# Patient Record
Sex: Female | Born: 2000 | Race: White | Hispanic: No | Marital: Single | Smoking: Never smoker
Health system: Southern US, Community
[De-identification: ages and names within clinical notes are randomized; demographics above are authoritative.]

## PROBLEM LIST (undated history)

## (undated) DIAGNOSIS — J45909 Unspecified asthma, uncomplicated: Secondary | ICD-10-CM

## (undated) DIAGNOSIS — F32A Depression, unspecified: Secondary | ICD-10-CM

## (undated) DIAGNOSIS — F419 Anxiety disorder, unspecified: Secondary | ICD-10-CM

## (undated) DIAGNOSIS — N390 Urinary tract infection, site not specified: Secondary | ICD-10-CM

## (undated) HISTORY — PX: NO PAST SURGERIES: SHX2092

## (undated) HISTORY — DX: Unspecified asthma, uncomplicated: J45.909

---
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
## 2021-03-23 ENCOUNTER — Other Ambulatory Visit: Payer: Self-pay

## 2021-03-23 ENCOUNTER — Emergency Department (HOSPITAL_BASED_OUTPATIENT_CLINIC_OR_DEPARTMENT_OTHER): Payer: Medicaid Other

## 2021-03-23 ENCOUNTER — Emergency Department (HOSPITAL_BASED_OUTPATIENT_CLINIC_OR_DEPARTMENT_OTHER)
Admission: EM | Admit: 2021-03-23 | Discharge: 2021-03-23 | Disposition: A | Discharge status: Home / Self Care | Payer: Medicaid Other | Attending: Emergency Medicine | Admitting: Emergency Medicine

## 2021-03-23 ENCOUNTER — Encounter (HOSPITAL_BASED_OUTPATIENT_CLINIC_OR_DEPARTMENT_OTHER): Payer: Self-pay | Admitting: *Deleted

## 2021-03-23 DIAGNOSIS — S29011A Strain of muscle and tendon of front wall of thorax, initial encounter: Secondary | ICD-10-CM | POA: Diagnosis not present

## 2021-03-23 DIAGNOSIS — R000 Tachycardia, unspecified: Secondary | ICD-10-CM | POA: Diagnosis not present

## 2021-03-23 DIAGNOSIS — Y92410 Unspecified street and highway as the place of occurrence of the external cause: Secondary | ICD-10-CM | POA: Insufficient documentation

## 2021-03-23 DIAGNOSIS — S46912A Strain of unspecified muscle, fascia and tendon at shoulder and upper arm level, left arm, initial encounter: Secondary | ICD-10-CM | POA: Insufficient documentation

## 2021-03-23 DIAGNOSIS — T148XXA Other injury of unspecified body region, initial encounter: Secondary | ICD-10-CM

## 2021-03-23 DIAGNOSIS — S161XXA Strain of muscle, fascia and tendon at neck level, initial encounter: Secondary | ICD-10-CM | POA: Insufficient documentation

## 2021-03-23 DIAGNOSIS — S169XXA Unspecified injury of muscle, fascia and tendon at neck level, initial encounter: Secondary | ICD-10-CM | POA: Diagnosis present

## 2021-03-23 LAB — PREGNANCY, URINE: Preg Test, Ur: NEGATIVE

## 2021-03-23 MED ORDER — OXYCODONE-ACETAMINOPHEN 5-325 MG PO TABS
1.0000 | ORAL_TABLET | Freq: Once | ORAL | Status: AC
  Administered 2021-03-23: 1 via ORAL
  Filled 2021-03-23: qty 1

## 2021-03-23 NOTE — ED Notes (Signed)
Pt called for xray , pt declined xray , request to see provider first

## 2021-03-23 NOTE — Discharge Instructions (Addendum)
Follow-up with primary care doctor as needed.  Take Tylenol or Motrin as needed for pain control.  Come back to ER if you develop difficulty breathing, abdominal pain or other new concerning symptoms.
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
## 2021-07-02 NOTE — L&D Delivery Note (Signed)
[facility] Delivery Note  Samantha Walton is a 21 y.o. G1P0 s/p SVD at [redacted]w[redacted]d. She was admitted for SOL/SROM.   ROM: 14h 58m with clear fluid GBS Status: neg Maximum Maternal Temperature: 98.2  Labor Progress: Progressed well with pitocin augmentation  Delivery Date/Time: 04/18/22, 0932 Delivery: Called to room and patient was complete and pushing. Head delivered ROA. Nuchal cord present, easily reduced. Shoulder and body delivered in usual fashion. Infant with spontaneous cry, placed on mother's abdomen, dried and stimulated. Cord clamped x 2 after 1-minute delay, and cut by FOB under my direct supervision. Cord blood drawn. Placenta delivered spontaneously with gentle cord traction. Fundus firm with massage and Pitocin. Labia, perineum, vagina, and cervix were inspected, no perineal lacerations. Superficial, non-bleeding labial abrasions left to heal by secondary intention.    Placenta: complete, three vessel cord appreciated Complications: none Lacerations: none EBL: 87 mL Analgesia: epidural  Postpartum Planning [ ]  message to sent to schedule follow-up  [ ]  vaccines UTD  Infant: female  APGARs 9/9  weight pending  Cecilio Asper, MD [facility], [place]

## 2021-10-11 ENCOUNTER — Telehealth (INDEPENDENT_AMBULATORY_CARE_PROVIDER_SITE_OTHER): Payer: Medicaid Other

## 2021-10-11 DIAGNOSIS — Z3401 Encounter for supervision of normal first pregnancy, first trimester: Secondary | ICD-10-CM

## 2021-10-11 DIAGNOSIS — Z3A00 Weeks of gestation of pregnancy not specified: Secondary | ICD-10-CM

## 2021-10-11 DIAGNOSIS — Z3400 Encounter for supervision of normal first pregnancy, unspecified trimester: Secondary | ICD-10-CM | POA: Insufficient documentation

## 2021-10-11 MED ORDER — GOJJI WEIGHT SCALE MISC: 1.0000 | 0 refills | Status: DC | PRN

## 2021-10-11 MED ORDER — BLOOD PRESSURE KIT DEVI: 1.0000 | 0 refills | Status: DC | PRN

## 2021-10-11 NOTE — Progress Notes (Signed)
New OB Intake ? ?I connected with  Samantha Walton on 10/11/21 at  8:15 AM EDT by MyChart Video Visit and verified that I am speaking with the correct person using two identifiers. Nurse is located at [facility] and pt is located at home. ? ?I discussed the limitations, risks, security and privacy concerns of performing an evaluation and management service by telephone and the availability of in person appointments. I also discussed with the patient that there may be a patient responsible charge related to this service. The patient expressed understanding and agreed to proceed. ? ?I explained I am completing New OB Intake today. We discussed her EDD of 04/28/2021 that is based on LMP of 07/22/2021. Pt is G1/P0. I reviewed her allergies, medications, Medical/Surgical/OB history, and appropriate screenings. I informed her of [facility] services. Based on history, this is a/an  pregnancy uncomplicated .  ? ?Patient Active Problem List  ? Diagnosis Date Noted  ? Supervision of low-risk first pregnancy 10/11/2021  ? ? ?Concerns addressed today ?-Patient was informed starting at age 5 we do pap smear for cervical cancer screening, patient is aware. ?-CMA will send list of pediatrician and medication that are safe to take while pregnancy through MyChart account. ?-Sent Blood Presssure device and weight scale to [employer]. CMA will provide phone number and address through MyChart.  ? ?Delivery Plans:  ?Plans to deliver at [facility].  ? ?MyChart/Babyscripts ?MyChart access verified. I explained pt will have some visits in office and some virtually. Babyscripts instructions given and order placed. Patient verifies receipt of registration text/e-mail. Account successfully created and app downloaded. ? ?Blood Pressure Cuff  ?Blood pressure cuff ordered for patient to pick-up from [employer]. Explained after first prenatal appt pt will check weekly and document in Babyscripts. ? ?Weight scale: Patient does not  have weight  scale. Weight scale ordered for patient to pick up from [employer].  ? ?Anatomy US ?Explained first scheduled [place] will be around 19 weeks. Anatomy US scheduled for 12/04/2021 at 2:30PM. Pt notified to arrive at 2:15PM. ?Scheduled AFP lab only appointment if CenteringPregnancy pt for same day as anatomy US.  ? ?Labs ?Discussed Avelina Laine genetic screening with patient. Would like both Panorama and Horizon drawn at new OB visit.Also if interested in genetic testing, tell patient she will need AFP 15-21 weeks to complete genetic testing .Routine prenatal labs needed. ? ?Covid Vaccine ?Patient has not covid vaccine.  ? ?Is patient a CenteringPregnancy candidate? Declined  ?  ?Is patient a Mom+Baby Combined Care candidate? Declined   ?  ? ?Informed patient of [facility] website  and placed link in her AVS.  ? ?Social Determinants of Health ?Food Insecurity: Patient denies food insecurity. ?WIC Referral: Patient is interested in referral to [facility].  ?Transportation: Patient denies transportation needs. ?Childcare: Discussed no children allowed at ultrasound appointments. Offered childcare services; patient declines childcare services at this time. ? ?Send link to Pregnancy Navigators ? ? ?Placed OB Box on problem list and updated ? ?First visit review ?I reviewed new OB appt with pt. I explained she will have a pelvic exam, ob bloodwork with genetic screening.. Explained pt will be seen by Jamilla R. Walker CNM at first visit; encounter routed to appropriate provider. Explained that patient will be seen by pregnancy navigator following visit with provider. [facility] information placed in AVS.  ? ?Vidal Schwalbe, CMA ?10/11/2021  8:37 AM  ?

## 2021-10-23 ENCOUNTER — Ambulatory Visit (INDEPENDENT_AMBULATORY_CARE_PROVIDER_SITE_OTHER): Payer: Medicaid Other | Admitting: Certified Nurse Midwife

## 2021-10-23 ENCOUNTER — Other Ambulatory Visit (HOSPITAL_COMMUNITY)
Admission: RE | Admit: 2021-10-23 | Discharge: 2021-10-23 | Disposition: A | Discharge status: Home / Self Care | Payer: Medicaid Other | Source: Ambulatory Visit | Attending: Certified Nurse Midwife | Admitting: Certified Nurse Midwife

## 2021-10-23 VITALS — BP 117/62 | HR 98 | Wt 121.0 lb

## 2021-10-23 DIAGNOSIS — Z3A13 13 weeks gestation of pregnancy: Secondary | ICD-10-CM

## 2021-10-23 DIAGNOSIS — Z3400 Encounter for supervision of normal first pregnancy, unspecified trimester: Secondary | ICD-10-CM

## 2021-10-23 DIAGNOSIS — A749 Chlamydial infection, unspecified: Secondary | ICD-10-CM

## 2021-10-23 DIAGNOSIS — Z111 Encounter for screening for respiratory tuberculosis: Secondary | ICD-10-CM

## 2021-10-23 DIAGNOSIS — Z3491 Encounter for supervision of normal pregnancy, unspecified, first trimester: Secondary | ICD-10-CM

## 2021-10-23 DIAGNOSIS — O2342 Unspecified infection of urinary tract in pregnancy, second trimester: Secondary | ICD-10-CM

## 2021-10-23 DIAGNOSIS — O98811 Other maternal infectious and parasitic diseases complicating pregnancy, first trimester: Secondary | ICD-10-CM

## 2021-10-23 NOTE — Progress Notes (Signed)
? ?History:  ? Samantha Walton is a 21 y.o. G1P0 at 54w2dby LMP/early ultrasound being seen today for her first obstetrical visit.  Her obstetrical history is significant for  none . Patient does intend to breast feed, wants Depo for birth control and would like a hospital circumcision for her baby if female. Pregnancy history fully reviewed. ? ?Patient reports no complaints. Has only had very mild nausea. Now having urinary symptoms consistent with prior UTIs: burning and pain with urination. Usually clears with increased water and cranberry juice but this has been going on for a full week with no improvement in symptoms. ?  ?HISTORY: ?OB History  ?Gravida Para Term Preterm AB Living  ?1 0 0 0 0 0  ?SAB IAB Ectopic Multiple Live Births  ?0 0 0 0 0  ?  ?# Outcome Date GA Lbr Len/2nd Weight Sex Delivery Anes PTL Lv  ?1 Current           ?  ?Pap smear not indicated due to age. ? ?Past Medical History:  ?Diagnosis Date  ? Asthma   ? ?No past surgical history on file. ?No family history on file. ?Social History  ? ?Tobacco Use  ? Smoking status: Never  ?Vaping Use  ? Vaping Use: Some days  ?Substance Use Topics  ? Alcohol use: No  ? Drug use: No  ? ?No Known Allergies ?Current Outpatient Medications on File Prior to Visit  ?Medication Sig Dispense Refill  ? Blood Pressure Monitoring (BLOOD PRESSURE KIT) DEVI 1 Device by Does not apply route as needed. 1 each 0  ? Misc. Devices (GOJJI WEIGHT SCALE) MISC 1 Device by Does not apply route as needed. 1 each 0  ? Prenatal Vit-Fe Fumarate-FA (PRENATAL PO) Take by mouth.    ? adapalene (DIFFERIN) 0.1 % gel Apply topically at bedtime. (Patient not taking: Reported on 10/11/2021) 45 g 0  ? clindamycin-benzoyl peroxide (BENZACLIN) gel Apply topically 2 (two) times daily. (Patient not taking: Reported on 10/11/2021) 25 g 0  ? estradiol cypionate (DEPO-ESTRADIOL) 5 MG/ML injection Inject into the muscle every 28 (twenty-eight) days. (Patient not taking: Reported on 10/11/2021)    ? ?No  current facility-administered medications on file prior to visit.  ? ? ?Review of Systems ?Pertinent items noted in HPI and remainder of comprehensive ROS otherwise negative. ?Physical Exam:  ? ?Vitals:  ? 10/23/21 1403  ?BP: 117/62  ?Pulse: 98  ?Weight: 120 lb 15.4 oz (54.9 kg)  ? ?Bedside Ultrasound for FHR check: cardiac activity noted as well as movement ?Patient informed that the ultrasound is considered a limited obstetric ultrasound and is not intended to be a complete ultrasound exam.  Patient also informed that the ultrasound is not being completed with the intent of assessing for fetal or placental anomalies or any pelvic abnormalities.  Explained that the purpose of today?s ultrasound is to assess for fetal heart rate.  Patient acknowledges the purpose of the exam and the limitations of the study. ? ?Constitutional: Well-developed, well-nourished pregnant female in no acute distress.  ?HEENT: PERRLA ?Skin: normal color and turgor, no rash ?Cardiovascular: normal rate & rhythm, no murmur ?Respiratory: normal effort ?GI: Abd soft, non-tender, gravid appropriate for gestational age ?MS: Extremities nontender, no edema, normal ROM ?Neurologic: Alert and oriented x 4.  ?GU: no CVA tenderness ?Pelvic: pelvic exam deferred, self-swabs collected ? ?Assessment & Plan:  ?1. Encounter for supervision of low-risk first pregnancy, antepartum ?- Doing well overall, no fetal movement yet ? ?2.  [redacted] weeks gestation of pregnancy ?- Routine OB care ? ?3. Screening for tuberculosis (for work) ?- Quantiferon-TB Gold plus ? ?4. Dysuria ?- urinalysis with reflex culture ? ?3. Initial obstetric visit in first trimester ?- Initial labs drawn. ?- Continue prenatal vitamins. ?- Problem list reviewed and updated. ?- Genetic Screening discussed, First trimester screen, Quad screen, and NIPS:  drawn . ?- Ultrasound discussed; fetal anatomic survey:  already scheduled . ?- Anticipatory guidance about prenatal visits given including  labs, ultrasounds, and testing. ?- Discussed usage of Babyscripts and virtual visits as additional source of managing and completing prenatal visits in midst of coronavirus and pandemic.   ?- Encouraged to complete MyChart Registration for her ability to review results, send requests, and have questions addressed.  ?- The nature of [place] [facility]/Faculty Practice with multiple MDs and Advanced Practice Providers was explained to patient; also emphasized that residents, students are part of our team. ?- Routine obstetric precautions reviewed. Encouraged to seek out care at office or emergency room [facility] MAU preferred) for urgent and/or emergent concerns. ?Return in about 2 weeks (around 11/06/2021) for IN-PERSON, LOB.  ?  ?Gaylan Gerold, MSN, CNM, IBCLC ?Certified Nurse Midwife, [place] ? ?

## 2021-10-24 ENCOUNTER — Encounter: Payer: Self-pay | Admitting: Certified Nurse Midwife

## 2021-10-24 LAB — CERVICOVAGINAL ANCILLARY ONLY
Chlamydia: POSITIVE — AB
Comment: NEGATIVE
Comment: NEGATIVE
Comment: NORMAL
Neisseria Gonorrhea: NEGATIVE
Trichomonas: NEGATIVE

## 2021-10-24 MED ORDER — AZITHROMYCIN 500 MG PO TABS: 1000.0000 mg | ORAL_TABLET | Freq: Once | ORAL | 1 refills | Status: AC

## 2021-10-24 NOTE — Addendum Note (Signed)
Addended by: Edd Arbour on: 10/24/2021 09:08 PM ? ? Modules accepted: Orders ? ?

## 2021-10-26 LAB — CBC/D/PLT+RPR+RH+ABO+RUBIGG...
Antibody Screen: NEGATIVE
Basophils Absolute: 0 10*3/uL (ref 0.0–0.2)
Basos: 0 %
EOS (ABSOLUTE): 0.1 10*3/uL (ref 0.0–0.4)
Eos: 1 %
HCV Ab: NONREACTIVE
HIV Screen 4th Generation wRfx: NONREACTIVE
Hematocrit: 37.5 % (ref 34.0–46.6)
Hemoglobin: 13 g/dL (ref 11.1–15.9)
Hepatitis B Surface Ag: NEGATIVE
Immature Grans (Abs): 0 10*3/uL (ref 0.0–0.1)
Immature Granulocytes: 0 %
Lymphocytes Absolute: 2.2 10*3/uL (ref 0.7–3.1)
Lymphs: 20 %
MCH: 30.4 pg (ref 26.6–33.0)
MCHC: 34.7 g/dL (ref 31.5–35.7)
MCV: 88 fL (ref 79–97)
Monocytes Absolute: 0.7 10*3/uL (ref 0.1–0.9)
Monocytes: 6 %
Neutrophils Absolute: 8 10*3/uL — ABNORMAL HIGH (ref 1.4–7.0)
Neutrophils: 73 %
Platelets: 265 10*3/uL (ref 150–450)
RBC: 4.27 x10E6/uL (ref 3.77–5.28)
RDW: 12.9 % (ref 11.7–15.4)
RPR Ser Ql: NONREACTIVE
Rh Factor: POSITIVE
Rubella Antibodies, IGG: 1.66 index (ref >0.99)
WBC: 11 10*3/uL — ABNORMAL HIGH (ref 3.4–10.8)

## 2021-10-26 LAB — QUANTIFERON-TB GOLD PLUS
QuantiFERON Mitogen Value: >10 IU/mL
QuantiFERON Nil Value: 0.14 IU/mL
QuantiFERON TB1 Ag Value: 0.13 IU/mL
QuantiFERON TB2 Ag Value: 0.12 IU/mL
QuantiFERON-TB Gold Plus: NEGATIVE

## 2021-10-26 LAB — HEMOGLOBIN A1C
Est. average glucose Bld gHb Est-mCnc: 103 mg/dL
Hgb A1c MFr Bld: 5.2 % (ref 4.8–5.6)

## 2021-10-26 LAB — CULTURE, OB URINE

## 2021-10-26 LAB — URINE CULTURE, OB REFLEX

## 2021-10-26 LAB — HCV INTERPRETATION

## 2021-10-28 ENCOUNTER — Encounter (HOSPITAL_COMMUNITY): Payer: Self-pay | Admitting: Family Medicine

## 2021-10-28 ENCOUNTER — Inpatient Hospital Stay (HOSPITAL_COMMUNITY)
Admission: AD | Admit: 2021-10-28 | Discharge: 2021-10-28 | Disposition: A | Discharge status: Home / Self Care | Payer: Medicaid Other | Attending: Family Medicine | Admitting: Family Medicine

## 2021-10-28 DIAGNOSIS — O26892 Other specified pregnancy related conditions, second trimester: Secondary | ICD-10-CM | POA: Insufficient documentation

## 2021-10-28 DIAGNOSIS — R519 Headache, unspecified: Secondary | ICD-10-CM | POA: Insufficient documentation

## 2021-10-28 DIAGNOSIS — O219 Vomiting of pregnancy, unspecified: Secondary | ICD-10-CM | POA: Insufficient documentation

## 2021-10-28 DIAGNOSIS — Z3A14 14 weeks gestation of pregnancy: Secondary | ICD-10-CM | POA: Diagnosis not present

## 2021-10-28 HISTORY — DX: Anxiety disorder, unspecified: F41.9

## 2021-10-28 HISTORY — DX: Depression, unspecified: F32.A

## 2021-10-28 HISTORY — DX: Urinary tract infection, site not specified: N39.0

## 2021-10-28 LAB — URINALYSIS, ROUTINE W REFLEX MICROSCOPIC
Bilirubin Urine: NEGATIVE
Glucose, UA: NEGATIVE mg/dL
Hgb urine dipstick: NEGATIVE
Ketones, ur: NEGATIVE mg/dL
Leukocytes,Ua: NEGATIVE
Nitrite: NEGATIVE
Protein, ur: NEGATIVE mg/dL
Specific Gravity, Urine: 1.024 (ref 1.005–1.030)
pH: 6 (ref 5.0–8.0)

## 2021-10-28 MED ORDER — CYCLOBENZAPRINE HCL 10 MG PO TABS: 10.0000 mg | ORAL_TABLET | Freq: Three times a day (TID) | ORAL | 1 refills | Status: DC | PRN

## 2021-10-28 MED ORDER — LACTATED RINGERS IV BOLUS
1000.0000 mL | Freq: Once | INTRAVENOUS | Status: AC
  Administered 2021-10-28: 1000 mL via INTRAVENOUS

## 2021-10-28 MED ORDER — DIPHENHYDRAMINE HCL 50 MG/ML IJ SOLN
25.0000 mg | Freq: Once | INTRAMUSCULAR | Status: AC
  Administered 2021-10-28: 25 mg via INTRAVENOUS
  Filled 2021-10-28: qty 1

## 2021-10-28 MED ORDER — METOCLOPRAMIDE HCL 5 MG/ML IJ SOLN
10.0000 mg | Freq: Once | INTRAMUSCULAR | Status: AC
Start: 2021-10-28 — End: 2021-10-28
  Administered 2021-10-28: 10 mg via INTRAVENOUS
  Filled 2021-10-28: qty 2

## 2021-10-28 NOTE — MAU Provider Note (Signed)
Patient Samantha Walton is a 21 y.o. G1P0 ? At 29w0dhere with complaints of headache that has been going for two weeks. She reports that she has had a HA for two weeks; been taking Tylenol 10064mq 6 hours at home, which has been helping. However, she and her FOB were concerned today about her tylenol use so they came in to be evaluated. She denies VB, abdominal pain, dysuria, vaginal discharge, SOB, fever, weakness, visual changes, tingling or any neurologic symptoms. She is an EMT.  ? ? ?History  ?  ? ?CSN: [id number] ?Arrival date and time: 10/28/21 1757 ? ? Event Date/Time  ? First Provider Initiated Contact with Patient 10/28/21 1903   ?  ? ?Chief Complaint  ?Patient presents with  ? Headache  ? ?Headache  ?This is a new problem. The current episode started 1 to 4 weeks ago. The problem occurs constantly. The pain is located in the Occipital region. The pain does not radiate. The quality of the pain is described as aching. The pain is at a severity of 3/10. The pain is mild. Associated symptoms include photophobia and vomiting. Pertinent negatives include no blurred vision, coughing, fever, insomnia, muscle aches, numbness, phonophobia, seizures or tinnitus.  ?She reports a slight sensitivity to light and also vomited this morning.  ?OB History   ? ? Gravida  ?1  ? Para  ?   ? Term  ?   ? Preterm  ?   ? AB  ?   ? Living  ?   ?  ? ? SAB  ?   ? IAB  ?   ? Ectopic  ?   ? Multiple  ?   ? Live Births  ?   ?   ?  ?  ? ? ?Past Medical History:  ?Diagnosis Date  ? Anxiety   ? Asthma   ? as a child  ? Depression   ? hx of taking Lexapro, been off it for a while, doing ok  ? UTI (urinary tract infection)   ? ? ?Past Surgical History:  ?Procedure Laterality Date  ? NO PAST SURGERIES    ? ? ?History reviewed. No pertinent family history. ? ?Social History  ? ?Tobacco Use  ? Smoking status: Never  ?Vaping Use  ? Vaping Use: Some days  ?Substance Use Topics  ? Alcohol use: No  ? Drug use: No  ? ? ?Allergies: No Known  Allergies ? ?Medications Prior to Admission  ?Medication Sig Dispense Refill Last Dose  ? acetaminophen (TYLENOL) 500 MG tablet Take 1,000 mg by mouth every 6 (six) hours as needed.   10/28/2021 at 1330  ? cetirizine (ZYRTEC) 10 MG tablet Take 10 mg by mouth daily. HA? Sinus or allergy related   10/28/2021  ? Blood Pressure Monitoring (BLOOD PRESSURE KIT) DEVI 1 Device by Does not apply route as needed. 1 each 0   ? Misc. Devices (GOJJI WEIGHT SCALE) MISC 1 Device by Does not apply route as needed. 1 each 0   ? Prenatal Vit-Fe Fumarate-FA (PRENATAL PO) Take by mouth.   10/26/2021  ? ? ?Review of Systems  ?Constitutional: Negative.  Negative for fever.  ?HENT: Negative.  Negative for tinnitus.   ?Eyes:  Positive for photophobia. Negative for blurred vision.  ?Respiratory: Negative.  Negative for cough.   ?Cardiovascular: Negative.   ?Gastrointestinal:  Positive for vomiting.  ?Genitourinary:  Negative for dyspareunia and vaginal bleeding.  ?Musculoskeletal: Negative.   ?Neurological:  Positive  for headaches. Negative for seizures and numbness.  ?Psychiatric/Behavioral:  The patient does not have insomnia.   ?Physical Exam  ? ?Blood pressure 117/63, pulse 94, temperature 98.6 ?F (37 ?C), temperature source Oral, resp. rate 16, height 5' 2"  (1.575 m), weight 55.9 kg, last menstrual period 07/22/2021, SpO2 100 %. ? ?Physical Exam ?Constitutional:   ?   Appearance: She is well-developed.  ?HENT:  ?   Head: Normocephalic.  ?Eyes:  ?   Extraocular Movements: Extraocular movements intact.  ?Pulmonary:  ?   Effort: Pulmonary effort is normal.  ?Abdominal:  ?   Palpations: Abdomen is soft.  ?Musculoskeletal:     ?   General: Normal range of motion.  ?Skin: ?   General: Skin is warm.  ?Neurological:  ?   Mental Status: She is alert.  ? ?MAU Course  ?Procedures ? ?MDM ?-patient had 1 L of LR IV plus reglan and benadryl and reports HA is now a 0/10. She denies any other complaints; she would like discharge.  ?-CBC not repeated as  it was just done on 4/24 and it was 13.0 ?FHT: 156 ?She denies any nausea, vomiting or other concerns at this time.  ?Assessment and Plan  ? ?1. Headache in pregnancy, antepartum, second trimester   ?-patient would like HA appt; message sent to [facility] to schedule HA consult ?-discouraged daily use of tylenol, although she is not exceeding the daily dose. Discussed that may need to go on a triptan if HA continue. If HA changes or she has neurological concerms, she should return to MAU.  ?-Rx for flexeril given, also recommended Mag Citrate 400-600 mg daily for HA.  ?-keep follow up appt on 5/10 ? ?Mervyn Skeeters Anila Bojarski ?10/28/2021, 8:45 PM  ?

## 2021-10-28 NOTE — Progress Notes (Addendum)
Reviewed the headache diary/log with patient that was printed with AVS.  Patient agrees to keep a log of date, time, location of headache. Prescription reviewed.  Patient verbalized understanding. Patient left ambulatory without distress.  ?

## 2021-10-28 NOTE — MAU Note (Incomplete)
Samantha Walton is a 21 y.o. at [redacted]w[redacted]d here in MAU reporting: been getting these headaches the past 2-3 wks.  Has been taking Tylenol. This morning, she had a pounding headache, threw up. She took Tylenol, went on to work, HA came back when Tylenol wore off. Been taking allergy medicine too, doesn't seem to be working either.. checked her BP, normal ? ?Onset of complaint: 2-3 wks ?Pain score: 2 ?Vitals:  ? 10/28/21 1809  ?BP: 117/63  ?Pulse: 94  ?Resp: 16  ?Temp: 98.6 ?F (37 ?C)  ?SpO2: 100%  ?   ?FHT:156 ?Lab orders placed from triage:  urine ?

## 2021-10-29 ENCOUNTER — Other Ambulatory Visit: Payer: Self-pay | Admitting: Student

## 2021-10-31 MED ORDER — CEFADROXIL 500 MG PO CAPS: 500.0000 mg | ORAL_CAPSULE | Freq: Two times a day (BID) | ORAL | 0 refills | Status: DC

## 2021-10-31 NOTE — Addendum Note (Signed)
Addended by: Edd Arbour on: 10/31/2021 09:45 AM ? ? Modules accepted: Orders ? ?

## 2021-11-02 DIAGNOSIS — Z0279 Encounter for issue of other medical certificate: Secondary | ICD-10-CM

## 2021-11-06 ENCOUNTER — Encounter: Payer: Self-pay | Admitting: Certified Nurse Midwife

## 2021-11-08 ENCOUNTER — Encounter: Payer: Medicaid Other | Admitting: Certified Nurse Midwife

## 2021-11-15 ENCOUNTER — Ambulatory Visit (INDEPENDENT_AMBULATORY_CARE_PROVIDER_SITE_OTHER): Payer: Medicaid Other | Admitting: Obstetrics and Gynecology

## 2021-11-15 VITALS — BP 114/73 | HR 120 | Wt 121.4 lb

## 2021-11-15 DIAGNOSIS — Z3A16 16 weeks gestation of pregnancy: Secondary | ICD-10-CM

## 2021-11-15 DIAGNOSIS — Z3402 Encounter for supervision of normal first pregnancy, second trimester: Secondary | ICD-10-CM

## 2021-11-15 NOTE — Progress Notes (Signed)
? ?  PRENATAL VISIT NOTE ? ?Subjective:  ?Samantha Walton is a 21 y.o. G1P0 at [redacted]w[redacted]d being seen today for ongoing prenatal care.  She is currently monitored for the following issues for this low-risk pregnancy and has Supervision of low-risk first pregnancy on their problem list. ? ?Patient doing well with no acute concerns today. She reports no complaints.  Contractions: Not present. Vag. Bleeding: None.  Movement: Absent. Denies leaking of fluid.  ? ?The following portions of the patient's history were reviewed and updated as appropriate: allergies, current medications, past family history, past medical history, past social history, past surgical history and problem list. Problem list updated. ? ?Objective:  ? ?Vitals:  ? 11/15/21 1104  ?BP: 114/73  ?Pulse: (!) 120  ?Weight: 121 lb 6.4 oz (55.1 kg)  ? ? ?Fetal Status: Fetal Heart Rate (bpm): 145 Fundal Height: 16 cm Movement: Absent    ? ?General:  Alert, oriented and cooperative. Patient is in no acute distress.  ?Skin: Skin is warm and dry. No rash noted.   ?Cardiovascular: Normal heart rate noted  ?Respiratory: Normal respiratory effort, no problems with respiration noted  ?Abdomen: Soft, gravid, appropriate for gestational age.  Pain/Pressure: Absent     ?Pelvic: Cervical exam deferred        ?Extremities: Normal range of motion.     ?Mental Status:  Normal mood and affect. Normal behavior. Normal judgment and thought content.  ? ?Assessment and Plan:  ?Pregnancy: G1P0 at [redacted]w[redacted]d ? ?1. [redacted] weeks gestation of pregnancy ? ? ?2. Encounter for supervision of low-risk first pregnancy in second trimester ?Counter routine prenatal care ? ?- AFP, Serum, Open Spina Bifida ? ?Preterm labor symptoms and general obstetric precautions including but not limited to vaginal bleeding, contractions, leaking of fluid and fetal movement were reviewed in detail with the patient. ? ?Please refer to After Visit Summary for other counseling recommendations.  ? ?Return in about 4 weeks  (around 12/13/2021) for ROB, in person. ? ? ?Mariel Aloe, MD ?Faculty Attending ?Center for [facility] ?  ?

## 2021-11-18 LAB — AFP, SERUM, OPEN SPINA BIFIDA
AFP MoM: 1.56
AFP Value: 67.4 ng/mL
Gest. Age on Collection Date: 16.6 weeks
Maternal Age At EDD: 21.1 yr
OSBR Risk 1 IN: 2331
Test Results:: NEGATIVE
Weight: 121 [lb_av]

## 2021-12-01 ENCOUNTER — Institutional Professional Consult (permissible substitution): Payer: Medicaid Other | Admitting: Physician Assistant

## 2021-12-04 ENCOUNTER — Other Ambulatory Visit: Payer: Self-pay | Admitting: Certified Nurse Midwife

## 2021-12-04 ENCOUNTER — Ambulatory Visit: Payer: Medicaid Other | Attending: Certified Nurse Midwife

## 2021-12-04 DIAGNOSIS — Z363 Encounter for antenatal screening for malformations: Secondary | ICD-10-CM | POA: Insufficient documentation

## 2021-12-04 DIAGNOSIS — Z3A19 19 weeks gestation of pregnancy: Secondary | ICD-10-CM | POA: Diagnosis not present

## 2021-12-04 DIAGNOSIS — O43122 Velamentous insertion of umbilical cord, second trimester: Secondary | ICD-10-CM | POA: Diagnosis not present

## 2021-12-04 DIAGNOSIS — O321XX0 Maternal care for breech presentation, not applicable or unspecified: Secondary | ICD-10-CM | POA: Diagnosis not present

## 2021-12-04 DIAGNOSIS — Z3401 Encounter for supervision of normal first pregnancy, first trimester: Secondary | ICD-10-CM

## 2021-12-05 ENCOUNTER — Other Ambulatory Visit: Payer: Self-pay | Admitting: *Deleted

## 2021-12-05 DIAGNOSIS — O43122 Velamentous insertion of umbilical cord, second trimester: Secondary | ICD-10-CM

## 2021-12-18 ENCOUNTER — Encounter: Payer: Medicaid Other | Admitting: Medical

## 2022-01-03 ENCOUNTER — Ambulatory Visit (INDEPENDENT_AMBULATORY_CARE_PROVIDER_SITE_OTHER): Payer: Medicaid Other | Admitting: Certified Nurse Midwife

## 2022-01-03 ENCOUNTER — Other Ambulatory Visit: Payer: Self-pay

## 2022-01-03 VITALS — BP 109/69 | HR 100 | Wt 131.8 lb

## 2022-01-03 DIAGNOSIS — Z3A23 23 weeks gestation of pregnancy: Secondary | ICD-10-CM

## 2022-01-03 DIAGNOSIS — O43122 Velamentous insertion of umbilical cord, second trimester: Secondary | ICD-10-CM

## 2022-01-03 DIAGNOSIS — Z3492 Encounter for supervision of normal pregnancy, unspecified, second trimester: Secondary | ICD-10-CM

## 2022-01-03 NOTE — Patient Instructions (Signed)
[facility] [address], [place], [place] [address] [phone] Hours: Sunday Closed Monday 9AM-6PM Tuesday 9AM-6PM Wednesday 9AM-6PM Thursday 9AM-6PM Friday           9AM-6PM Saturday         10AM-1PM  

## 2022-01-03 NOTE — Progress Notes (Signed)
   PRENATAL VISIT NOTE  Subjective:  Samantha Walton is a 21 y.o. G1P0 at [redacted]w[redacted]d being seen today for ongoing prenatal care.  She is currently monitored for the following issues for this low-risk pregnancy and has Supervision of low-risk first pregnancy on their problem list.  Patient reports no complaints.  Contractions: Not present. Vag. Bleeding: None.  Movement: Present. Denies leaking of fluid.   The following portions of the patient's history were reviewed and updated as appropriate: allergies, current medications, past family history, past medical history, past social history, past surgical history and problem list.   Objective:   Vitals:   01/03/22 0933  BP: 109/69  Pulse: 100  Weight: 131 lb 12.8 oz (59.8 kg)    Fetal Status: Fetal Heart Rate (bpm): 148   Movement: Present     General:  Alert, oriented and cooperative. Patient is in no acute distress.  Skin: Skin is warm and dry. No rash noted.   Cardiovascular: Normal heart rate noted  Respiratory: Normal respiratory effort, no problems with respiration noted  Abdomen: Soft, gravid, appropriate for gestational age.  Pain/Pressure: Absent     Pelvic: Cervical exam deferred        Extremities: Normal range of motion.  Edema: None  Mental Status: Normal mood and affect. Normal behavior. Normal judgment and thought content.   Assessment and Plan:  Pregnancy: G1P0 at [redacted]w[redacted]d 1. Encounter for supervision of low-risk pregnancy in second trimester - Doing well, feeling regular and vigorous fetal movement   2. [redacted] weeks gestation of pregnancy - Routine OB care including anticipatory guidance re GTT at next visit  3. 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 Reassured her that these risks are low and we will monitor closely to avoid complications.    Preterm labor symptoms and general obstetric precautions including but not limited to vaginal bleeding, contractions, leaking of fluid and fetal movement were reviewed in detail with the patient. Please refer to After Visit Summary for other counseling recommendations.   Return in about 4 weeks (around 01/31/2022) for IN-PERSON, LOB/GTT.  Future Appointments  Date Time Provider Department Center  01/08/2022  2:30 PM [facility] NURSE [facility]  01/08/2022  2:45 PM [facility] US4 [facility]  02/01/2022  8:20 AM [facility] LAB [facility]  02/01/2022 10:35 AM Dan Humphreys, Judye Bos, CNM [facility]    Bernerd Limbo, CNM

## 2022-01-08 ENCOUNTER — Encounter: Payer: Self-pay | Admitting: *Deleted

## 2022-01-08 ENCOUNTER — Other Ambulatory Visit: Payer: Self-pay | Admitting: *Deleted

## 2022-01-08 ENCOUNTER — Ambulatory Visit: Payer: Medicaid Other | Attending: Obstetrics and Gynecology

## 2022-01-08 ENCOUNTER — Ambulatory Visit: Payer: Medicaid Other | Admitting: *Deleted

## 2022-01-08 VITALS — BP 113/64 | HR 97

## 2022-01-08 DIAGNOSIS — O43122 Velamentous insertion of umbilical cord, second trimester: Secondary | ICD-10-CM | POA: Insufficient documentation

## 2022-01-08 DIAGNOSIS — Z363 Encounter for antenatal screening for malformations: Secondary | ICD-10-CM | POA: Diagnosis not present

## 2022-01-08 DIAGNOSIS — O358XX0 Maternal care for other (suspected) fetal abnormality and damage, not applicable or unspecified: Secondary | ICD-10-CM | POA: Diagnosis not present

## 2022-01-08 DIAGNOSIS — Z3A24 24 weeks gestation of pregnancy: Secondary | ICD-10-CM

## 2022-01-08 DIAGNOSIS — O43123 Velamentous insertion of umbilical cord, third trimester: Secondary | ICD-10-CM

## 2022-01-31 ENCOUNTER — Other Ambulatory Visit: Payer: Self-pay

## 2022-01-31 DIAGNOSIS — Z3403 Encounter for supervision of normal first pregnancy, third trimester: Secondary | ICD-10-CM

## 2022-02-01 ENCOUNTER — Other Ambulatory Visit: Payer: Medicaid Other

## 2022-02-01 ENCOUNTER — Other Ambulatory Visit: Payer: Self-pay

## 2022-02-01 ENCOUNTER — Ambulatory Visit (INDEPENDENT_AMBULATORY_CARE_PROVIDER_SITE_OTHER): Payer: Medicaid Other | Admitting: Certified Nurse Midwife

## 2022-02-01 VITALS — BP 115/68 | HR 97 | Wt 138.8 lb

## 2022-02-01 DIAGNOSIS — Z3A27 27 weeks gestation of pregnancy: Secondary | ICD-10-CM

## 2022-02-01 DIAGNOSIS — Z3403 Encounter for supervision of normal first pregnancy, third trimester: Secondary | ICD-10-CM

## 2022-02-01 DIAGNOSIS — Z3493 Encounter for supervision of normal pregnancy, unspecified, third trimester: Secondary | ICD-10-CM

## 2022-02-01 NOTE — Progress Notes (Signed)
   PRENATAL VISIT NOTE  Subjective:  Samantha Walton is a 21 y.o. G1P0 at [redacted]w[redacted]d being seen today for ongoing prenatal care.  She is currently monitored for the following issues for this low-risk pregnancy and has Supervision of low-risk first pregnancy on their problem list.  Patient reports no complaints.  Contractions: Not present. Vag. Bleeding: None.  Movement: Present. Denies leaking of fluid.   The following portions of the patient's history were reviewed and updated as appropriate: allergies, current medications, past family history, past medical history, past social history, past surgical history and problem list.   Objective:   Vitals:   02/01/22 1001  BP: 115/68  Pulse: 97  Weight: 138 lb 12.8 oz (63 kg)    Fetal Status: Fetal Heart Rate (bpm): 135   Movement: Present     General:  Alert, oriented and cooperative. Patient is in no acute distress.  Skin: Skin is warm and dry. No rash noted.   Cardiovascular: Normal heart rate noted  Respiratory: Normal respiratory effort, no problems with respiration noted  Abdomen: Soft, gravid, appropriate for gestational age.  Pain/Pressure: Present     Pelvic: Cervical exam deferred        Extremities: Normal range of motion.  Edema: None  Mental Status: Normal mood and affect. Normal behavior. Normal judgment and thought content.   Assessment and Plan:  Pregnancy: G1P0 at 106w5d 1. Supervision of low-risk pregnancy, third trimester - Doing well, feeling regular and vigorous fetal movement   2. [redacted] weeks gestation of pregnancy - Routine OB care including GTT which she is tolerating well  Preterm labor symptoms and general obstetric precautions including but not limited to vaginal bleeding, contractions, leaking of fluid and fetal movement were reviewed in detail with the patient. Please refer to After Visit Summary for other counseling recommendations.   Return in about 2 weeks (around 02/15/2022) for IN-PERSON, LOB.  Future  Appointments  Date Time Provider Department Center  02/05/2022  3:30 PM [facility] NURSE [facility]  02/05/2022  3:45 PM [facility] US5 [facility]  03/06/2022  3:30 PM [facility] NURSE [facility]  03/06/2022  3:45 PM [facility] US4 [facility]    Bernerd Limbo, CNM

## 2022-02-02 LAB — RPR: RPR Ser Ql: NONREACTIVE

## 2022-02-02 LAB — GLUCOSE TOLERANCE, 2 HOURS W/ 1HR
Glucose, 1 hour: 144 mg/dL (ref 70–179)
Glucose, 2 hour: 110 mg/dL (ref 70–152)
Glucose, Fasting: 72 mg/dL (ref 70–91)

## 2022-02-02 LAB — CBC
Hematocrit: 33.1 % — ABNORMAL LOW (ref 34.0–46.6)
Hemoglobin: 11.4 g/dL (ref 11.1–15.9)
MCH: 31 pg (ref 26.6–33.0)
MCHC: 34.4 g/dL (ref 31.5–35.7)
MCV: 90 fL (ref 79–97)
Platelets: 322 10*3/uL (ref 150–450)
RBC: 3.68 x10E6/uL — ABNORMAL LOW (ref 3.77–5.28)
RDW: 13.3 % (ref 11.7–15.4)
WBC: 10.9 10*3/uL — ABNORMAL HIGH (ref 3.4–10.8)

## 2022-02-02 LAB — HIV ANTIBODY (ROUTINE TESTING W REFLEX): HIV Screen 4th Generation wRfx: NONREACTIVE

## 2022-02-05 ENCOUNTER — Encounter: Payer: Self-pay | Admitting: *Deleted

## 2022-02-05 ENCOUNTER — Ambulatory Visit: Payer: Medicaid Other | Attending: Certified Nurse Midwife

## 2022-02-05 ENCOUNTER — Ambulatory Visit: Payer: Medicaid Other | Admitting: *Deleted

## 2022-02-05 DIAGNOSIS — O35BXX0 Maternal care for other (suspected) fetal abnormality and damage, fetal cardiac anomalies, not applicable or unspecified: Secondary | ICD-10-CM

## 2022-02-05 DIAGNOSIS — Z3A28 28 weeks gestation of pregnancy: Secondary | ICD-10-CM

## 2022-02-05 DIAGNOSIS — O43123 Velamentous insertion of umbilical cord, third trimester: Secondary | ICD-10-CM | POA: Diagnosis present

## 2022-02-19 NOTE — Progress Notes (Unsigned)
   PRENATAL VISIT NOTE  Subjective:  Samantha Walton is a 21 y.o. G1P0 at [redacted]w[redacted]d being seen today for ongoing prenatal care.  She is currently monitored for the following issues for this {Blank single:19197::"high-risk","low-risk"} pregnancy and has Supervision of low-risk first pregnancy on their problem list.  Patient reports {sx:14538}.   .  .   . Denies leaking of fluid.   The following portions of the patient's history were reviewed and updated as appropriate: allergies, current medications, past family history, past medical history, past social history, past surgical history and problem list.   Objective:  There were no vitals filed for this visit.  Fetal Status:           General:  Alert, oriented and cooperative. Patient is in no acute distress.  Skin: Skin is warm and dry. No rash noted.   Cardiovascular: Normal heart rate noted  Respiratory: Normal respiratory effort, no problems with respiration noted  Abdomen: Soft, gravid, appropriate for gestational age.        Pelvic: {Blank single:19197::"Cervical exam performed in the presence of a chaperone","Cervical exam deferred"}        Extremities: Normal range of motion.     Mental Status: Normal mood and affect. Normal behavior. Normal judgment and thought content.   Assessment and Plan:  Pregnancy: G1P0 at [redacted]w[redacted]d 1. Encounter for supervision of low-risk first pregnancy in third trimester ***  {Blank single:19197::"Term","Preterm"} labor symptoms and general obstetric precautions including but not limited to vaginal bleeding, contractions, leaking of fluid and fetal movement were reviewed in detail with the patient. Please refer to After Visit Summary for other counseling recommendations.   No follow-ups on file.  Future Appointments  Date Time Provider Department Center  02/21/2022 10:15 AM Federico Flake, MD [facility] [address]  03/06/2022  3:30 PM [facility] NURSE [facility]  03/06/2022  3:45 PM [facility] US4 [facility]   03/08/2022 11:15 AM Anyanwu, Jethro Bastos, MD [facility]  03/22/2022  1:15 PM Marylene Land, CNM [facility]  04/05/2022  1:15 PM Marylene Land, CNM [facility]  04/12/2022 10:15 AM Carlynn Herald, CNM [facility]  04/19/2022 10:15 AM Marylene Land, CNM [facility]  04/26/2022  9:15 AM Reva Bores, MD [facility]    Federico Flake, MD

## 2022-02-21 ENCOUNTER — Ambulatory Visit (INDEPENDENT_AMBULATORY_CARE_PROVIDER_SITE_OTHER): Payer: Medicaid Other | Admitting: Family Medicine

## 2022-02-21 ENCOUNTER — Other Ambulatory Visit: Payer: Self-pay

## 2022-02-21 VITALS — BP 107/57 | HR 104 | Wt 142.0 lb

## 2022-02-21 DIAGNOSIS — Z3403 Encounter for supervision of normal first pregnancy, third trimester: Secondary | ICD-10-CM

## 2022-02-21 DIAGNOSIS — Z23 Encounter for immunization: Secondary | ICD-10-CM

## 2022-02-21 DIAGNOSIS — F419 Anxiety disorder, unspecified: Secondary | ICD-10-CM

## 2022-02-22 ENCOUNTER — Encounter: Payer: Medicaid Other | Admitting: Family Medicine

## 2022-02-26 NOTE — BH Specialist Note (Signed)
Integrated Behavioral Health via Telemedicine Visit  03/07/2022 Samantha Walton [id number]  Number of Integrated Behavioral Health Clinician visits: 1- Initial Visit  Session Start time: 0815   Session End time: 0855  Total time in minutes: 40   Referring Provider: Jaynie Collins, MD Patient/Family location: Home [facility] Provider location: Center for [facility] at [facility]  All persons participating in visit: Patient Samantha Walton and [name]   Types of Service: Individual psychotherapy and Video visit  I connected with Samantha Walton and/or Samantha Walton's  n/a  via  Telephone or [employer]  (Video is Surveyor, mining) and verified that I am speaking with the correct person using two identifiers. Discussed confidentiality: Yes   I discussed the limitations of telemedicine and the availability of in person appointments.  Discussed there is a possibility of technology failure and discussed alternative modes of communication if that failure occurs.  I discussed that engaging in this telemedicine visit, they consent to the provision of behavioral healthcare and the services will be billed under their insurance.  Patient and/or legal guardian expressed understanding and consented to Telemedicine visit: Yes   Presenting Concerns: Patient and/or family reports the following symptoms/concerns: Fatigue, anxiety, irritability; goal is to prevent escalation of depression and anxiety postpartum. Duration of problem: Current pregnancy increase; Severity of problem: moderate  Patient and/or Family's Strengths/Protective Factors: Social connections, Concrete supports in place (healthy food, safe environments, etc.), Sense of purpose, and Physical Health (exercise, healthy diet, medication compliance, etc.)  Goals Addressed: Patient will:  Reduce symptoms of: anxiety   Increase knowledge and/or ability of: healthy habits    Demonstrate ability to: Increase healthy adjustment to current life circumstances  Progress towards Goals: Ongoing  Interventions: Interventions utilized:  Functional Assessment of ADLs, Psychoeducation and/or Health Education, Link to [employer], and Supportive Reflection Standardized Assessments completed: Not Needed  Patient and/or Family Response: Patient agrees with treatment plan.   Assessment: Patient currently experiencing Anxiety disorder, unspecified.   Patient may benefit from psychoeducation and brief therapeutic interventions regarding coping with symptoms of anxiety .  Plan: Follow up with behavioral health clinician on : Three weeks Behavioral recommendations:  -Continue taking prenatal vitamin daily -Continue prioritizing healthy self-care (regular meals, adequate rest) -Consider taking childbirth class at [email or web address] or WIC class -Read through Postpartum Planner on After Visit Summary; use as needed  Referral(s): Integrated Art gallery manager (In Clinic) and [employer]:  childbirth classes  I discussed the assessment and treatment plan with the patient and/or parent/guardian. They were provided an opportunity to ask questions and all were answered. They agreed with the plan and demonstrated an understanding of the instructions.   They were advised to call back or seek an in-person evaluation if the symptoms worsen or if the condition fails to improve as anticipated.  Rae Lips, LCSW     02/01/2022   10:04 AM 01/03/2022    9:40 AM 11/15/2021   11:36 AM  Depression screen PHQ 2/9  Decreased Interest 1 2 1   Down, Depressed, Hopeless 1 1 1   PHQ - 2 Score 2 3 2   Altered sleeping 2 2 2   Tired, decreased energy 3 3 3   Change in appetite 1 3 3   Feeling bad or failure about yourself  0 0 0  Trouble concentrating 1 1 1   Moving slowly or fidgety/restless 1 1 1   Suicidal thoughts 0 0 0  PHQ-9 Score 10 13 12   Difficult  doing work/chores   Not difficult at all      02/21/2022   11:09 AM 02/01/2022   10:04 AM 01/03/2022    9:40 AM 11/15/2021   11:36 AM  GAD 7 : Generalized Anxiety Score  Nervous, Anxious, on Edge 3 2 3 3   Control/stop worrying 2 2 2 2   Worry too much - different things 2 2 3 2   Trouble relaxing 2 2 3 2   Restless 2 1 1 1   Easily annoyed or irritable 3 3 3 3   Afraid - awful might happen 2 2 2 1   Total GAD 7 Score 16 14 17  14

## 2022-03-06 ENCOUNTER — Other Ambulatory Visit: Payer: Self-pay | Admitting: *Deleted

## 2022-03-06 ENCOUNTER — Encounter: Payer: Self-pay | Admitting: *Deleted

## 2022-03-06 ENCOUNTER — Ambulatory Visit: Payer: Medicaid Other | Attending: Maternal & Fetal Medicine

## 2022-03-06 ENCOUNTER — Ambulatory Visit: Payer: Medicaid Other | Admitting: *Deleted

## 2022-03-06 VITALS — BP 123/71 | HR 117

## 2022-03-06 DIAGNOSIS — O358XX0 Maternal care for other (suspected) fetal abnormality and damage, not applicable or unspecified: Secondary | ICD-10-CM | POA: Diagnosis not present

## 2022-03-06 DIAGNOSIS — Z3A32 32 weeks gestation of pregnancy: Secondary | ICD-10-CM | POA: Diagnosis not present

## 2022-03-06 DIAGNOSIS — O43123 Velamentous insertion of umbilical cord, third trimester: Secondary | ICD-10-CM | POA: Diagnosis present

## 2022-03-06 DIAGNOSIS — O35BXX0 Maternal care for other (suspected) fetal abnormality and damage, fetal cardiac anomalies, not applicable or unspecified: Secondary | ICD-10-CM

## 2022-03-06 DIAGNOSIS — Z3403 Encounter for supervision of normal first pregnancy, third trimester: Secondary | ICD-10-CM | POA: Insufficient documentation

## 2022-03-07 ENCOUNTER — Ambulatory Visit (INDEPENDENT_AMBULATORY_CARE_PROVIDER_SITE_OTHER): Payer: Self-pay | Admitting: Clinical

## 2022-03-07 DIAGNOSIS — F419 Anxiety disorder, unspecified: Secondary | ICD-10-CM

## 2022-03-07 NOTE — Patient Instructions (Signed)
Center for [facility] [place], [place] [address] [phone] (main office) [phone] [facility] office)  [email or web address] (childbirth classes, etc.)     BRAINSTORMING  Develop a Plan Goals: Provide a way to start conversation about your new life with a baby Assist parents in recognizing and using resources within their reach Help pave the way before birth for an easier period of transition afterwards.  Make a list of the following information to keep in a central location: Full name of Mom and Partner: _____________________________________________ 53 full name and Date of Birth: ___________________________________________ Home Address: ___________________________________________________________ ________________________________________________________________________ Home Phone: ____________________________________________________________ Parents' cell numbers: _____________________________________________________ ________________________________________________________________________ Name and contact info for OB: ______________________________________________ Name and contact info for Pediatrician:________________________________________ Contact info for Lactation Consultants: ________________________________________  REST and SLEEP *You each need at least 4-5 hours of uninterrupted sleep every day. Write specific names and contact information.* How are you going to rest in the postpartum period? While partner's home? When partner returns to work? When you both return to work? Where will your baby sleep? Who is available to help during the day? Evening? Night? Who could move in for a period to help support you? What are some ideas to help you get enough  sleep? __________________________________________________________________________________________________________________________________________________________________________________________________________________________________________ NUTRITIOUS FOOD AND DRINK *Plan for meals before your baby is born so you can have healthy food to eat during the immediate postpartum period.* Who will look after breakfast? Lunch? Dinner? List names and contact information. Brainstorm quick, healthy ideas for each meal. What can you do before baby is born to prepare meals for the postpartum period? How can others help you with meals? Which grocery stores provide online shopping and delivery? Which restaurants offer take-out or delivery options? ______________________________________________________________________________________________________________________________________________________________________________________________________________________________________________________________________________________________________________________________________________________________________________________________________  CARE FOR MOM *It's important that mom is cared for and pampered in the postpartum period. Remember, the most important ways new mothers need care are: sleep, nutrition, gentle exercise, and time off.* Who can come take care of mom during this period? Make a list of people with their contact information. List some activities that make you feel cared for, rested, and energized? Who can make sure you have opportunities to do these things? Does mom have a space of her very own within your home that's just for her? Make a "[facility]" where she can be comfortable, rest, and renew herself  daily. ______________________________________________________________________________________________________________________________________________________________________________________________________________________________________________________________________________________________________________________________________________________________________________________________________    CARE FOR AND FEEDING BABY *Knowledgeable and encouraging people will offer the best support with regard to feeding your baby.* Educate yourself and choose the best feeding option for your baby. Make a list of people who will guide, support, and be a resource for you as your care for and feed your baby. (Friends that have breastfed or are currently breastfeeding, lactation consultants, breastfeeding support groups, etc.) Consider a postpartum doula. (These websites can give you information: dona.org & BuyingShow.es) Seek out local breastfeeding resources like the breastfeeding support group at [employer] or [employer]. ______________________________________________________________________________________________________________________________________________________________________________________________________________________________________________________________________________________________________________________________________________________________________________________________________  Verner Chol AND ERRANDS Who can help with a thorough cleaning before baby is born? Make a list of people who will help with housekeeping and chores, like laundry, light cleaning, dishes, bathrooms, etc. Who can run some errands for you? What can you do to make sure you are stocked with basic supplies before baby is born? Who is going to do the  shopping? ______________________________________________________________________________________________________________________________________________________________________________________________________________________________________________________________________________________________________________________________________________________________________________________________________     Family Adjustment *Nurture yourselves.it helps parents be more loving and allows for better bonding with their child.* What sorts of things  do you and partner enjoy doing together? Which activities help you to connect and strengthen your relationship? Make a list of those things. Make a list of people whom you trust to care for your baby so you can have some time together as a couple. What types of things help partner feel connected to Mom? Make a list. What needs will partner have in order to bond with baby? Other children? Who will care for them when you go into labor and while you are in the hospital? Think about what the needs of your older children might be. Who can help you meet those needs? In what ways are you helping them prepare for bringing baby home? List some specific strategies you have for family adjustment. _______________________________________________________________________________________________________________________________________________________________________________________________________________________________________________________________________________________________________________________________________________  SUPPORT *Someone who can empathize with experiences normalizes your problems and makes them more bearable.* Make a list of other friends, neighbors, and/or co-workers you know with infants (and small children, if applicable) with whom you can connect. Make a list of local or online support groups, mom groups, etc. in which you can be  involved. ______________________________________________________________________________________________________________________________________________________________________________________________________________________________________________________________________________________________________________________________________________________________________________________________________  Childcare Plans Investigate and plan for childcare if mom is returning to work. Talk about mom's concerns about her transition back to work. Talk about partner's concerns regarding this transition.  Mental Health *Your mental health is one of the highest priorities for a pregnant or postpartum mom.* 1 in 5 women experience anxiety and/or depression from the time of conception through the first year after birth. Postpartum Mood Disorders are the #1 complication of pregnancy and childbirth and the suffering experienced by these mothers is not necessary! These illnesses are temporary and respond well to treatment, which often includes self-care, social support, talk therapy, and medication when needed. Women experiencing anxiety and depression often say things like: "I'm supposed to be happy.why do I feel so sad?", "Why can't I snap out of it?", "I'm having thoughts that scare me." There is no need to be embarrassed if you are feeling these symptoms: Overwhelmed, anxious, angry, sad, guilty, irritable, hopeless, exhausted but can't sleep You are NOT alone. You are NOT to blame. With help, you WILL be well. Where can I find help? Medical professionals such as your OB, midwife, gynecologist, family practitioner, primary care provider, pediatrician, or mental health providers; [facility] support groups: Feelings After Birth, Breastfeeding Support Group, Baby and Me Group, and Fit 4 Two exercise classes. You have permission to ask for help. It will confirm your feelings, validate your experiences,  share/learn coping strategies, and gain support and encouragement as you heal. You are important! BRAINSTORM Make a list of local resources, including resources for mom and for partner. Identify support groups. Identify people to call late at night - include names and contact info. Talk with partner about perinatal mood and anxiety disorders. Talk with your OB, midwife, and doula about baby blues and about perinatal mood and anxiety disorders. Talk with your pediatrician about perinatal mood and anxiety disorders.   Support & Sanity Savers   What do you really need?  Basics In preparing for a new baby, many expectant parents spend hours shopping for baby clothes, decorating the nursery, and deciding which car seat to buy. Yet most don't think much about what the reality of parenting a newborn will be like, and what they need to make it through that. So, here is the advice of experienced parents. We know you'll read this, and think "they're exaggerating, I don't really need that." Just trust [place] on these,  OK? Plan for all of this, and if it turns out you don't need it, come back and teach [place] how you did it!  Must-Haves (Once baby's survival needs are met, make sure you attend to your own survival needs!) Sleep An average newborn sleeps 16-18 hours per day, over 6-7 sleep periods, rarely more than three hours at a time. It is normal and healthy for a newborn to wake throughout the night... but really hard on parents!! Naps. Prioritize sleep above any responsibilities like: cleaning house, visiting friends, running errands, etc.  Sleep whenever baby sleeps. If you can't nap, at least have restful times when baby eats. The more rest you get, the more patient you will be, the more emotionally stable, and better at solving problems.  Food You may not have realized it would be difficult to eat when you have a newborn. Yet, when we talk to countless new parents, they say things like "it may be 2:00 pm  when I realize I haven't had breakfast yet." Or "every time we sit down to dinner, baby needs to eat, and my food gets cold, so I don't bother to eat it." Finger food. Before your baby is born, stock up with one months' worth of food that: 1) you can eat with one hand while holding a baby, 2) doesn't need to be prepped, 3) is good hot or cold, 4) doesn't spoil when left out for a few hours, and 5) you like to eat. Think about: nuts, dried fruit, Clif bars, pretzels, jerky, gogurt, baby carrots, apples, bananas, crackers, cheez-n-crackers, string cheese, hot pockets or frozen burritos to microwave, garden burgers and breakfast pastries to put in the toaster, yogurt drinks, etc. Restaurant Menus. Make lists of your favorite restaurants & menu items. When family/friends want to help, you can give specific information without much thought. They can either bring you the food or send gift cards for just the right meals. Freezer Meals.  Take some time to make a few meals to put in the freezer ahead of time.  Easy to freeze meals can be anything such as soup, lasagna, chicken pie, or spaghetti sauce. Set up a Meal Schedule.  Ask friends and family to sign up to bring you meals during the first few weeks of being home. (It can be passed around at baby showers!) You have no idea how helpful this will be until you are in the throes of parenting.  https://hamilton-woodard.com/ is a great website to check out. Emotional Support Know who to call when you're stressed out. Parenting a newborn is very challenging work. There are times when it totally overwhelms your normal coping abilities. EVERY NEW PARENT NEEDS TO HAVE A PLAN FOR WHO TO CALL WHEN THEY JUST CAN'T COPE ANY MORE. (And it has to be someone other than the baby's other parent!) Before your baby is born, come up with at least one person you can call for support - write their phone number down and post it on the refrigerator. Anxiety & Sadness. Baby blues are normal after  pregnancy; however, there are more severe types of anxiety & sadness which can occur and should not be ignored.  They are always treatable, but you have to take the first step by reaching out for help. [facility] offers a "Mom Talk" group which meets every Tuesday from 10 am - 11 am.  This group is for new moms who need support and connection after their babies are born.  Call [phone].  Really,  Really Helpful (Plan for them! Make sure these happen often!!) Physical Support with Taking Care of Yourselves Asking friends and family. Before your baby is born, set up a schedule of people who can come and visit and help out (or ask a friend to schedule for you). Any time someone says "let me know what I can do to help," sign them up for a day. When they get there, their job is not to take care of the baby (that's your job and your joy). Their job is to take care of you!  Postpartum doulas. If you don't have anyone you can call on for support, look into postpartum doulas:  professionals at helping parents with caring for baby, caring for themselves, getting breastfeeding started, and helping with household tasks. www.padanc.org is a helpful website for learning about doulas in our area. Peer Support / Parent Groups Why: One of the greatest ideas for new parents is to be around other new parents. Parent groups give you a chance to share and listen to others who are going through the same season of life, get a sense of what is normal infant development by watching several babies learn and grow, share your stories of triumph and struggles with empathetic ears, and forgive your own mistakes when you realize all parents are learning by trial and error. Where to find: There are many places you can meet other new parents throughout our community.  [facility] offers the following classes for new moms and their little ones:  Baby and Me (Birth to [place]) and Breastfeeding Support Group. Go to  www.conehealthybaby.com or call [phone] for more information. Time for your Relationship It's easy to get so caught up in meeting baby's immediate needs that it's hard to find time to connect with your partner, and meet the needs of your relationship. It's also easy to forget what "quality time with your partner" actually looks like. If you take your baby on a date, you'd be amazed how much of your couple time is spent feeding the baby, diapering the baby, admiring the baby, and talking about the baby. Dating: Try to take time for just the two of you. Babysitter tip: Sometimes when moms are breastfeeding a newborn, they find it hard to figure out how to schedule outings around baby's unpredictable feeding schedules. Have the babysitter come for a three hour period. When she comes over, if baby has just eaten, you can leave right away, and come back in two hours. If baby hasn't fed recently, you start the date at home. Once baby gets hungry and gets a good feeding in, you can head out for the rest of your date time. Date Nights at Home: If you can't get out, at least set aside one evening a week to prioritize your relationship: whenever baby dozes off or doesn't have any immediate needs, spend a little time focusing on each other. Potential conflicts: The main relationship conflicts that come up for new parents are: issues related to sexuality, financial stresses, a feeling of an unfair division of household tasks, and conflicts in parenting styles. The more you can work on these issues before baby arrives, the better!  Fun and Frills (Don't forget these. and don't feel guilty for indulging in them!) Everyone has something in life that is a fun little treat that they do just for themselves. It may be: reading the morning paper, or going for a daily jog, or having coffee with a friend once a week, or going to  a movie on Friday nights, or fine chocolates, or bubble baths, or curling up with a good  book. Unless you do fun things for yourself every now and then, it's hard to have the energy for fun with your baby. Whatever your "special" treats are, make sure you find a way to continue to indulge in them after your baby is born. These special moments can recharge you, and allow you to return to baby with a new joy   PERINATAL MOOD DISORDERS: [place]   _________________________________________Emergency and Crisis Resources If you are an imminent risk to self or others, are experiencing intense personal distress, and/or have noticed significant changes in activities of daily living, call:  [place]: [phone]  [address], [place], [place], [address] Mobile Crisis: [place]: 988 Or visit the following crisis centers: Local Emergency Departments [facility]: [address] [place]. Hours: 8:30AM-5PM. Insurance Accepted: Medicaid, Medicare, and Uninsured.  [facility]:  [address] [place]  Mon-Friday 8am-3pm, [phone]                                                                                  ___________ Non-Crisis Resources To identify specific providers that are covered by your insurance, contact your insurance company or local agencies:  [place]: [phone] [facility] [employer]: [place]: [phone] Postpartum Support International- Warm-line: 579-629-8599                                                      __Outpatient Therapy and Medication Management   Providers:  [facility]: [phone] Hours: 9AM-5PM  Insurance Accepted: Alben Spittle, Shane Crutch, [place], [place] [facility]): [phone] Hours: 8AM-5:30PM  nsurance Accepted: All insurances EXCEPT AARP, [place],  [place], and [place]: [phone] Hours: 8AM-8PM Insurance Accepted: Cristal Ford, Freddrick March, [place], Medicare, [phone] [facility]: [phone] Hours: 8:30AM-7PM Insurance Accepted: Cristal Ford, Medicaid, Medicare, Tricare, [employer] Counseling:  [place] Accepted:  Holland Falling, Lorella Nimrod, [employer], [place]: [phone] Hours: 9AM-5:30PM Insurance Accepted: Alben Spittle, Charlotte Crumb, and Medicaid, Medicare, [facility]:  [phone] Hours: 9am-5pm Insurance Accepted: BCBS; they do not accept Medicaid/Medicare [place]: [phone] Hours: 9am-9pm Insurance Accepted: All major insurance including Medicaid and Medicare [facility]: [phone] Hours: [place] Accepted: All insurances EXCEPT Medicaid and Medicare. [facility]: [phone]   ____________  Parenting Support Groups [facility]: [phone] [facility]:  [phone] Family Support Network: (support for children in the NICU and/or with special needs), [phone]   ___________                                                                 Mental Health Support Groups Mental Health Association: [phone]    _____________                                                                                  Online Resources Postpartum Support International: http://www.postpartum.net/  800-944-4PPD 2Moms Supporting Moms:  www.momssupportingmoms.net    

## 2022-03-08 ENCOUNTER — Other Ambulatory Visit: Payer: Self-pay

## 2022-03-08 ENCOUNTER — Ambulatory Visit (INDEPENDENT_AMBULATORY_CARE_PROVIDER_SITE_OTHER): Payer: Medicaid Other | Admitting: Family Medicine

## 2022-03-08 VITALS — BP 119/75 | HR 116 | Wt 146.0 lb

## 2022-03-08 DIAGNOSIS — Z3A32 32 weeks gestation of pregnancy: Secondary | ICD-10-CM

## 2022-03-08 DIAGNOSIS — Z3403 Encounter for supervision of normal first pregnancy, third trimester: Secondary | ICD-10-CM

## 2022-03-08 NOTE — Progress Notes (Signed)
   PRENATAL VISIT NOTE  Subjective:  Samantha Walton is a 21 y.o. G1P0 at [redacted]w[redacted]d being seen today for ongoing prenatal care.  She is currently monitored for the following issues for this low-risk pregnancy and has Supervision of low-risk first pregnancy on their problem list.  Patient reports no complaints.  Contractions: Not present.  .  Movement: Present. Denies leaking of fluid.   The following portions of the patient's history were reviewed and updated as appropriate: allergies, current medications, past family history, past medical history, past social history, past surgical history and problem list.   Objective:   Vitals:   03/08/22 1135  BP: 119/75  Pulse: (!) 116  Weight: 146 lb (66.2 kg)    Fetal Status: Fetal Heart Rate (bpm): 132 Fundal Height: 31 cm Movement: Present     General:  Alert, oriented and cooperative. Patient is in no acute distress.  Skin: Skin is warm and dry. No rash noted.   Cardiovascular: Normal heart rate noted  Respiratory: Normal respiratory effort, no problems with respiration noted  Abdomen: Soft, gravid, appropriate for gestational age.  Pain/Pressure: Absent     Pelvic: Cervical exam deferred        Extremities: Normal range of motion.     Mental Status: Normal mood and affect. Normal behavior. Normal judgment and thought content.   Assessment and Plan:  Pregnancy: G1P0 at [redacted]w[redacted]d 1. Encounter for supervision of low-risk first pregnancy in third trimester Continue routine prenatal care.   Preterm labor symptoms and general obstetric precautions including but not limited to vaginal bleeding, contractions, leaking of fluid and fetal movement were reviewed in detail with the patient. Please refer to After Visit Summary for other counseling recommendations.   Return in 2 weeks (on 03/22/2022).  Future Appointments  Date Time Provider Department Center  03/22/2022  1:15 PM Madlyn Frankel [facility]  03/28/2022  8:15 AM  [facility]  04/03/2022  2:45 PM [facility] NURSE [facility]  04/03/2022  3:00 PM [facility] US1 [facility]  04/05/2022  1:15 PM Marylene Land, CNM [facility]  04/12/2022 10:15 AM Carlynn Herald, CNM [facility]  04/19/2022 10:15 AM Marylene Land, CNM [facility]  04/26/2022  9:15 AM Reva Bores, MD [facility]  05/01/2022  2:45 PM [facility] NURSE [facility]  05/01/2022  3:00 PM [facility] US1 [facility]    Reva Bores, MD

## 2022-03-19 NOTE — BH Specialist Note (Unsigned)
Integrated Behavioral Health via Telemedicine Visit  03/28/2022 LARENDA REEDY [id number]  Number of [place] Clinician visits: 2- Second Visit  Session Start time: [phone]   Session End time: 0838  Total time in minutes: 21   Referring Provider: Verita Schneiders, MD Patient/Family location: Home [facility] Provider location: [facility] for [place] [facility]  All persons participating in visit: Patient Samantha Walton and [place]   Types of Service: Individual psychotherapy and Video visit  I connected with Phyllis Ginger and/or [place]  via  Telephone or [employer]  (Video is Tree surgeon) and verified that I am speaking with the correct person using two identifiers. Discussed confidentiality: Yes   I discussed the limitations of telemedicine and the availability of in person appointments.  Discussed there is a possibility of technology failure and discussed alternative modes of communication if that failure occurs.  I discussed that engaging in this telemedicine visit, they consent to the provision of behavioral healthcare and the services will be billed under their insurance.  Patient and/or legal guardian expressed understanding and consented to Telemedicine visit: Yes   Presenting Concerns: Patient and/or family reports the following symptoms/concerns: Preparing for baby's arrival with some anxiety regarding FOB being available when labor begins; good social support, sleeping and eating well.  Duration of problem: Current pregnancy; Severity of problem: moderate  Patient and/or Family's Strengths/Protective Factors: Social connections, Concrete supports in place (healthy food, safe environments, etc.), Sense of purpose, and Physical Health (exercise, healthy diet, medication compliance, etc.)  Goals Addressed: Patient will:  Reduce symptoms of: anxiety, depression, and  stress   Increase knowledge and/or ability of: healthy habits   Demonstrate ability to: Increase healthy adjustment to current life circumstances and Increase motivation to adhere to plan of care  Progress towards Goals: Ongoing  Interventions: Interventions utilized:  Functional Assessment of ADLs and Supportive Reflection Standardized Assessments completed: Not Needed  Patient and/or Family Response: Patient agrees with treatment plan.   Assessment: Patient currently experiencing Anxiety disorder, unspecified .   Patient may benefit from continued therapeutic intervention.  Plan: Follow up with behavioral health clinician on : Six weeks (about 2 weeks postpartum) Behavioral recommendations:  -Continue taking prenatal vitamin as prescribed --Continue prioritizing healthy self-care (regular meals, adequate rest) daily -Read and use information on After Visit Summary as needed  Referral(s): [place] (In Clinic)  I discussed the assessment and treatment plan with the patient and/or parent/guardian. They were provided an opportunity to ask questions and all were answered. They agreed with the plan and demonstrated an understanding of the instructions.   They were advised to call back or seek an in-person evaluation if the symptoms worsen or if the condition fails to improve as anticipated.  Garlan Fair, LCSW     03/22/2022    1:31 PM 03/08/2022   12:02 PM 02/01/2022   10:04 AM 01/03/2022    9:40 AM 11/15/2021   11:36 AM  Depression screen PHQ 2/9  Decreased Interest 1 1 1 2 1   Down, Depressed, Hopeless 0 0 1 1 1   PHQ - 2 Score 1 1 2 3 2   Altered sleeping 2 2 2 2 2   Tired, decreased energy 3 2 3 3 3   Change in appetite 1 1 1 3 3   Feeling bad or failure about yourself  0 0 0 0 0  Trouble concentrating 2 1 1 1 1   Moving  slowly or fidgety/restless 2 1 1 1 1   Suicidal thoughts 0 0 0 0 0  PHQ-9 Score 11 8 10 13 12   Difficult doing work/chores      Not difficult at all      03/22/2022    1:32 PM 03/08/2022   12:02 PM 02/21/2022   11:09 AM 02/01/2022   10:04 AM  GAD 7 : Generalized Anxiety Score  Nervous, Anxious, on Edge 2 2 3 2   Control/stop worrying 2 2 2 2   Worry too much - different things 2 2 2 2   Trouble relaxing 2 2 2 2   Restless 2 1 2 1   Easily annoyed or irritable 2 2 3 3   Afraid - awful might happen 1 2 2 2   Total GAD 7 Score 13 13 16  14

## 2022-03-21 NOTE — Progress Notes (Unsigned)
   PRENATAL VISIT NOTE  Subjective:  Samantha Walton is a 21 y.o. G1P0 at [redacted]w[redacted]d being seen today for ongoing prenatal care.  She is currently monitored for the following issues for this {Blank single:19197::"high-risk","low-risk"} pregnancy and has Supervision of low-risk first pregnancy on their problem list.  Patient reports {sx:14538}.   .  .   . Denies leaking of fluid.   The following portions of the patient's history were reviewed and updated as appropriate: allergies, current medications, past family history, past medical history, past social history, past surgical history and problem list.   Objective:  There were no vitals filed for this visit.  Fetal Status:           General:  Alert, oriented and cooperative. Patient is in no acute distress.  Skin: Skin is warm and dry. No rash noted.   Cardiovascular: Normal heart rate noted  Respiratory: Normal respiratory effort, no problems with respiration noted  Abdomen: Soft, gravid, appropriate for gestational age.        Pelvic: {Blank single:19197::"Cervical exam performed in the presence of a chaperone","Cervical exam deferred"}        Extremities: Normal range of motion.     Mental Status: Normal mood and affect. Normal behavior. Normal judgment and thought content.   Assessment and Plan:  Pregnancy: G1P0 at [redacted]w[redacted]d There are no diagnoses linked to this encounter. {Blank single:19197::"Term","Preterm"} labor symptoms and general obstetric precautions including but not limited to vaginal bleeding, contractions, leaking of fluid and fetal movement were reviewed in detail with the patient. Please refer to After Visit Summary for other counseling recommendations.   No follow-ups on file.  Future Appointments  Date Time Provider [place]  03/22/2022  1:15 PM [facility]  03/28/2022  8:15 AM [facility]-BEHAVIORAL HE[facility] CLINICIAN [facility]  04/03/2022  2:45 PM [facility]-M[facility] NURSE [facility]  04/03/2022   3:00 PM [facility] US1 [facility]  04/05/2022  1:15 PM Starr [place], CNM [facility]  04/12/2022 10:15 AM Deloris Ping, CNM [facility]  04/19/2022 10:15 AM Shelda Pal, DO [facility]  04/26/2022  9:15 AM Donnamae Jude, MD [facility]  05/01/2022  2:30 PM [facility]-M[facility] NURSE [facility]  05/01/2022  2:45 PM [facility]-MFC US5 [facility]-MFCUS [place]    Curt Bears Alvis Lemmings, CNM

## 2022-03-22 ENCOUNTER — Ambulatory Visit (INDEPENDENT_AMBULATORY_CARE_PROVIDER_SITE_OTHER): Payer: Medicaid Other | Admitting: Student

## 2022-03-22 ENCOUNTER — Other Ambulatory Visit: Payer: Self-pay

## 2022-03-22 VITALS — BP 113/67 | HR 106 | Wt 148.0 lb

## 2022-03-22 DIAGNOSIS — Z3403 Encounter for supervision of normal first pregnancy, third trimester: Secondary | ICD-10-CM

## 2022-03-22 DIAGNOSIS — Z3A34 34 weeks gestation of pregnancy: Secondary | ICD-10-CM

## 2022-03-22 NOTE — Patient Instructions (Signed)
AREA PEDIATRIC/FAMILY PRACTICE PHYSICIANS  Central/[place]outheast [place] ([address]) [facility] Chambliss, MD; Eniola, MD; Hale, MD; Hensel, MD; McDiarmid, MD; McIntyer, MD; Neal, MD; Walden, MD [address] [place], [place] [address] [phone] Mon-Fri 8:30-12:30, 1:30-5:00 Providers come to see babies at [facility] Accepting Medicaid [facility] providers who accept newborns: Koirala, MD; Morrow, MD; Wolters, MD [address], [place], [place] [address] [phone] Mon-Fri 8:00-5:30 Babies seen by providers at [facility] Does NOT accept Medicaid Please call early in hospitalization for appointment (limited availability)  [facility] Mulberry, MD [address] [place], [place] [address] [phone] Mon, Tue, Thur, Fri 8:30-5:00, Wed 10:00-7:00 (closed 1-2pm) Babies seen by [facility] providers Accepting Medicaid Rubin - Pediatrician Rubin, MD [address], [place], [place] [address] [phone] Mon-Fri 8:30-5:00, Sat 8:30-12:00 Provider comes to see babies at [facility] Accepting Medicaid Must have been referred from current patients or contacted office prior to delivery [facility]) Brown, MD; Chandler, MD; Ettefagh, MD; Grant, MD; Lester, MD; [place], MD; McQueen, MD; Prose, MD; Simha, MD; Stanley, MD; Stryffeler, NP; Tebben, NP [address], [place], [place] [address] [phone] Mon, Tue, Thur, Fri 8:30-5:30, Wed 9:30-5:30, Sat 8:30-12:30 Babies seen by [facility] providers Accepting Medicaid Only accepting infants of first-time parents or siblings of current patients Hospital discharge coordinator will make follow-up appointment Jack Amos [address], [place], [place]  [address] [phone]   Fax - [phone] [facility] [address], [place], [place]  [address] Phone - [phone]   Fax - [phone] Shilpa Gosrani [address], [place], [place]  [address] [phone]  [place] ([address]) [facility] Bates, MD; [place], MD; Cooper, Cox, MD; MD; Davis, MD; Dovico, MD; Ettefaugh, MD; Little, MD; Lowe, MD; Keiffer, MD; Melvin, MD; Sumner, MD; Williams, MD [address], [place], [place] [address] [phone] Mon-Fri 8:30-5:00 (extended evenings Mon-Thur as needed), Sat-Sun 10:00-1:00 Providers come to see babies at [facility] Accepting Medicaid for families of first-time babies and families with all children in the household age 3 and under. Must register with office prior to making appointment (M-F only). [facility] Henson, NP; Knapp, MD; Lalonde, MD; Tysinger, PA [address] [place], [place] [address] [phone] Mon-Fri 8:00-5:00 Babies seen by providers at [facility] Does NOT accept Medicaid/Commercial Insurance Only [facility]  Artis, MD; Barnes, MD; Bratton, MD; Coccaro, MD; [place] Gardner, MD; Kramer, MD; Marshall, MD; Netherton, MD; Poleto, MD; Skinner, MD [address] [place], [place] [address] [phone] Mon-Fri 8:30-5:30, Sat (Oct.-Mar.) 9:00-1:00 Babies seen by providers at [facility] Accepting Medicaid  [place] ([address]) [facility] Reid, MD; Warner, MD [address], [place], [place] [address] [phone] Mon-Fri 8:30-5:00, Sat 8:30-12:00 Providers come to see babies at [facility] Does NOT accept Medicaid [facility] Becker, PA; Hagler, MD; Scifres, PA; Sun, MD; Swayne, MD [address], [place], [place] [address] [phone] Mon-Fri 8:00-5:00 Babies seen by providers at [facility] Does NOT accept Medicaid Only accepting babies of parents who  are patients Please call early in hospitalization for appointment (limited availability)  Pediatricians Clark, MD; Frye, MD; Kelleher, MD; Mack, NP; Miller, MD; O'Keller, MD; Patterson, NP; Pudlo, MD; Puzio, MD; Thomas, MD; Tucker, MD; Twiselton, MD [address], [place], [place] [address] [phone] Mon-Fri 8:00-5:00, Sat 9:00-12:00 Providers come to see babies at [facility] Does NOT accept Medicaid  [place] ([address]) [facility] providers accepting new patients: Brake, NP; Wharton, PA [address], [place], [place] [address] [phone] Mon-Fri 8:00-5:00 Babies seen by providers at [facility] Does NOT accept Medicaid Only accepting babies of parents who are patients Please call early in hospitalization for appointment (limited availability) [facility] Gay, MD; Quinlan, MD [address] [place], [place] [address] [phone] (press 1 to schedule appointment) Mon-Fri 8:00-5:00 Providers come to see babies at [facility] Does NOT accept Medicaid [facility] Mazer, MD [address] [place], [place] [address] [phone] Mon-Fri 8:30-5:00 (lunch 12:30-1:00), extended hours by appointment only Wed 5:00-6:30 Babies seen by [facility] providers Accepting Medicaid [facility] Banks, MD; Jordan, MD; Koberlein, MD [address], [place], [place] [address] [phone] Mon-Fri 8:00-5:00 Babies seen by [facility] providers Does NOT accept Medicaid [facility] Parker, MD; Hunter, MD; Wallace, DO [address] [place], [place] [address] [phone] Mon-Fri 8:00-5:00 Babies seen by [facility] providers Does NOT accept Medicaid [facility] Brandon, PA; Brecken, PA; Christy, NP; Dees, MD; DeClaire, MD; DeWeese, MD; Hansen, NP; Mills, NP; Parrish, NP; Smoot, NP; Summer, MD; Vapne,  MD [address] [place], [place] [address] [phone] Mon-Fri 8:30-5:00, Sat 10:00-1:00 Providers come to see babies at [facility] Does NOT accept Medicaid Free prenatal information session Tuesdays at 4:45pm [facility] Bouska, MD; Gordon, PA; Jeffery, PA; Weber, PA [address] [place] [address] [phone] Mon-Fri 7:30-5:30 Babies seen by [facility] providers [facility] [address], [place], [place]  [address] [phone]   Fax - [phone]  [place] ([address] & [address]) [facility] [place] [address] [place], [place] [address] [phone] Mon-Thur 8:00-6:00 Providers come to see babies at [facility] Accepting Medicaid [facility] Anderson, NP; Badger, MD; Beal, PA; Spencer, PA [address] [place], [place] [address] [phone] Mon-Thur 7:30-7:30, Fri 7:30-4:30 Babies seen by [facility] providers Accepting Medicaid [facility] Agbuya, MD; Klett, NP; Romgoolam, MD [address], [place], [place] [address] [phone] Mon-Fri 8:30-5:00, Sat 8:30-12:00 Providers come to see babies at [facility] Accepting Medicaid Must have "Meet & Greet" appointment at office prior to delivery [facility]) McCord, MD; Wallace, MD; Wood, MD [address], [place], [place] [address] [phone] Mon-Wed 8:00-6:00, Thur-Fri 8:00-5:00, Sat 9:00-12:00 Providers come to see babies at [facility] Does NOT accept Medicaid Only accepting siblings of current patients [facility]  [address], [place], [place]  [address] [phone]   Fax - [phone] [facility] [place] [address], [place], [place]  [address] [phone]   Fax -  [phone]  [place] ([address] & [address]) [facility] Cirigliano, DO; Matthews, DO [address] , [place] [address] [phone] Mon-Fri 7:00-5:00 Babies seen by [facility] providers Does NOT accept Medicaid [facility] Briscoe, MD; Howley, PA; Moreira, PA [address], [place], [place] [address] [phone] Mon-Fri 8:00-5:00 Babies seen by [facility] providers Accepting Medicaid [facility] Boyd, MD; Church, PA; Jones, NP; Osborn, PA [address], , [place] [address] [phone] Mon-Fri 8:00-5:00 Babies seen by providers at [facility] Accepting Medicaid  [place] ([address]) [facility] [place] Wendling, DO [address] [place], [place] [address] [phone] Mon-Fri 8:00-5:00 Babies seen by [facility] providers Does NOT accept Medicaid Limited availability, please call early in hospitalization to schedule follow-up [facility] Calderon, PA; Cummings, MD; Dillard, MD; Martin, PA; Olson, MD; VanDeven, PA [address], [place], [place] [address] [phone] Mon-Fri 8:30-5:00, Sat 9:00-12:00 Babies seen by providers at [facility] Accepting Medicaid Please register online then schedule online or call office [email or web address] Hunter, NP; Kumar, MD; Martin Rogers, PA [address], [place], [place] [address] [phone] Mon-Fri 8:00-5:00 Babies seen by providers at [facility] Accepting Medicaid [facility] [employer], MD; Kristi Fleenor, NP; West, MD [address], [place], [place] [address] [phone] Mon-Fri 8:00-5:30, Sat&Sun by appointment (phones open at  8:30) Babies seen by [facility] providers Accepting Medicaid Must be a first-time baby or sibling of current patient [facility] [place]  [address], [place], [place]  [address] [phone]   Fax - [phone]  [place] ([address]) [facility] Brown, PA; Cowen, PA; Rice, MD; Helton, PA; Spry, MD [address] [place], [place] [address] [phone] Mon-Thur 8:00-7:00, Fri 8:00-5:00, Sat 8:00-12:00, Sun 9:00-12:00 Babies seen by [facility] providers Accepting Medicaid [facility] [place] Coe-Goins, MD; Marshall, MD; Pierre-Louis, MD [address], [place], [place] [address] [phone] Mon-Thur 8:00-5:00 Babies seen by providers at [facility] Accepting Medicaid [facility] Brat[place], MD; Coe-Goins, MD; Hayes, MD; Lewis, MD; List, MD; Lott, MD; Marshall, MD; Moran, MD; O'Neal, MD; Pierre-Louis, MD; Pitonzo, MD; Scholer, MD; Spangle, MD [address] [place], [place] [address] [phone] Mon-Fri 8:00-5:30, Sat (Oct.-Mar.) 9:00-1:00 Babies seen by providers at [facility] Accepting Medicaid Must fill out new patient packet, available online at [email or web address] [facility]) Friddle, NP; Harris, NP; Kelly, NP; Logan, MD; Melvin, PA; Poth, MD; Ramadoss, MD; Stanton, NP [address], [place], [place] [address] [phone] Mon-Thur 8:00-5:30, Fri 8:00-5:00 Babies seen by providers at [facility] Accepting Medicaid  [place] ([address]) [facility] Dixon, PA; [place], MD; Pickard, MD; Tapia, PA [address], [place], [place] [address] [phone] Mon-Fri 8:00-5:00 Babies seen by providers at [facility] Accepting Medicaid   [place] ([address]) [facility] [place] Masneri, DO; Meyers, MD; Nelson, PA [address], [place], [place] [address] [phone] Mon-Fri 8:00-5:00 Babies seen by providers at [facility] Does NOT accept Medicaid Limited appointment availability, please call early in hospitalization  [facility] [place] Kunedd, DO; McGowen, MD [address], [place],  [address] [phone] Mon-Fri 8:00-5:00 Babies seen by [facility] providers Does NOT accept Medicaid [facility] [place] Cameron, MD; MacDonald, MD; Michaels, PA; Nayak, MD [address] [place] [address], [place],  [address] [phone] Mon-Fri 8:00-5:00 After hours clinic ([address] [place],  [address]) [phone] Mon-Fri 5:00-8:00, Sat 12:00-6:00, Sun 10:00-4:00 Babies seen by [facility] providers Accepting Medicaid [facility] [place] [address], [place], [place]  [address] [phone]   Fax - [phone]  [place] ([address]) [facility] [place] Andy, MD [address], [place], [place] [address] [phone] Mon-Fri 8:00-5:00 Babies seen by [facility] providers Does NOT accept Medicaid [facility] [place] ([facility] [place]) Eksir, MD [address], [place], [place] [address] [phone] Mon-Thur 8:00-7:00, Fri 8:00-5:00, Sat 8:00-12:00 Babies seen by providers at [facility] Accepting Medicaid - but does not have vaccinations in office (must be received elsewhere) Limited availability, please call early in hospitalization  [place] ([address]) [facility]  Charlene Flemming, MD [address], [place] [address] [phone]  Fax [phone]  [facility]  Kimberly Newton, MD, Annamarie Streilein, PA, Carla Hampton, PA [address] [place], [place]  [address] [phone] [facility]  [address], [place], [place] [address] [phone] [address], [place], [place] [address] [phone] (West Office)  [facility] [address], [place], [place] [address] [phone] [facility] [address], [place], [place] [address] [phone] [facility] [address], [place], [place] [address] [phone] [facility] [address], [place], [place] [address] [phone] [facility] [address], [place], [place] [address] [phone] [facility] [address], [place], [place] [address] [phone] [facility]  [address], [place], [place] [address] [phone] Dr[facility] [address] [place] [address], [place], [place] [address] [phone] [facility] [address], [address], [place], [place] [address] [phone] [facility] [address], , [place] [address] [phone]  

## 2022-03-28 ENCOUNTER — Ambulatory Visit (INDEPENDENT_AMBULATORY_CARE_PROVIDER_SITE_OTHER): Payer: Medicaid Other | Admitting: Clinical

## 2022-03-28 DIAGNOSIS — F419 Anxiety disorder, unspecified: Secondary | ICD-10-CM | POA: Diagnosis not present

## 2022-03-28 NOTE — Patient Instructions (Signed)
[facility] [place] [facility] [address] , [place] [address] [phone] (main office) [phone] ([place] Kassebaum's office)   

## 2022-03-29 ENCOUNTER — Inpatient Hospital Stay (HOSPITAL_COMMUNITY)
Admission: AD | Admit: 2022-03-29 | Discharge: 2022-03-29 | Disposition: A | Discharge status: Home / Self Care | Payer: Medicaid Other | Attending: Obstetrics and Gynecology | Admitting: Obstetrics and Gynecology

## 2022-03-29 ENCOUNTER — Encounter (HOSPITAL_COMMUNITY): Payer: Self-pay | Admitting: Obstetrics and Gynecology

## 2022-03-29 ENCOUNTER — Telehealth: Payer: Self-pay | Admitting: Family Medicine

## 2022-03-29 ENCOUNTER — Other Ambulatory Visit: Payer: Self-pay

## 2022-03-29 DIAGNOSIS — R519 Headache, unspecified: Secondary | ICD-10-CM | POA: Diagnosis not present

## 2022-03-29 DIAGNOSIS — R030 Elevated blood-pressure reading, without diagnosis of hypertension: Secondary | ICD-10-CM

## 2022-03-29 DIAGNOSIS — Z3A35 35 weeks gestation of pregnancy: Secondary | ICD-10-CM

## 2022-03-29 DIAGNOSIS — Z79899 Other long term (current) drug therapy: Secondary | ICD-10-CM | POA: Insufficient documentation

## 2022-03-29 DIAGNOSIS — O26893 Other specified pregnancy related conditions, third trimester: Secondary | ICD-10-CM

## 2022-03-29 LAB — COMPREHENSIVE METABOLIC PANEL
ALT: 34 U/L (ref 0–44)
AST: 41 U/L (ref 15–41)
Albumin: 2.8 g/dL — ABNORMAL LOW (ref 3.5–5.0)
Alkaline Phosphatase: 205 U/L — ABNORMAL HIGH (ref 38–126)
Anion gap: 4 — ABNORMAL LOW (ref 5–15)
BUN: 10 mg/dL (ref 6–20)
CO2: 20 mmol/L — ABNORMAL LOW (ref 22–32)
Calcium: 8.1 mg/dL — ABNORMAL LOW (ref 8.9–10.3)
Chloride: 107 mmol/L (ref 98–111)
Creatinine, Ser: 0.7 mg/dL (ref 0.44–1.00)
GFR, Estimated: >60 mL/min (ref >60)
Glucose, Bld: 84 mg/dL (ref 70–99)
Potassium: 4.3 mmol/L (ref 3.5–5.1)
Sodium: 131 mmol/L — ABNORMAL LOW (ref 135–145)
Total Bilirubin: 0.4 mg/dL (ref 0.3–1.2)
Total Protein: 6.4 g/dL — ABNORMAL LOW (ref 6.5–8.1)

## 2022-03-29 LAB — CBC
HCT: 31.7 % — ABNORMAL LOW (ref 36.0–46.0)
Hemoglobin: 11.6 g/dL — ABNORMAL LOW (ref 12.0–15.0)
MCH: 32.3 pg (ref 26.0–34.0)
MCHC: 36.6 g/dL — ABNORMAL HIGH (ref 30.0–36.0)
MCV: 88.3 fL (ref 80.0–100.0)
Platelets: 283 10*3/uL (ref 150–400)
RBC: 3.59 MIL/uL — ABNORMAL LOW (ref 3.87–5.11)
RDW: 12.8 % (ref 11.5–15.5)
WBC: 9.7 10*3/uL (ref 4.0–10.5)
nRBC: 0 % (ref 0.0–0.2)

## 2022-03-29 LAB — PROTEIN / CREATININE RATIO, URINE
Creatinine, Urine: 142 mg/dL
Protein Creatinine Ratio: 0.18 mg/mg{Cre} — ABNORMAL HIGH (ref 0.00–0.15)
Total Protein, Urine: 26 mg/dL

## 2022-03-29 LAB — URINALYSIS, ROUTINE W REFLEX MICROSCOPIC
Bilirubin Urine: NEGATIVE
Glucose, UA: NEGATIVE mg/dL
Hgb urine dipstick: NEGATIVE
Ketones, ur: NEGATIVE mg/dL
Leukocytes,Ua: NEGATIVE
Nitrite: NEGATIVE
Protein, ur: 30 mg/dL — AB
Specific Gravity, Urine: 1.018 (ref 1.005–1.030)
pH: 6 (ref 5.0–8.0)

## 2022-03-29 MED ORDER — BUTALBITAL-APAP-CAFFEINE 50-325-40 MG PO TABS
2.0000 | ORAL_TABLET | Freq: Once | ORAL | Status: AC
  Administered 2022-03-29: 2 via ORAL
  Filled 2022-03-29: qty 2

## 2022-03-29 NOTE — MAU Provider Note (Signed)
History     CSN: [id number]  Arrival date and time: 03/29/22 1547   Event Date/Time   First Provider Initiated Contact with Patient 03/29/22 1643      Chief Complaint  Patient presents with   Headache   Hypertension   HPI  Samantha Walton is a 21 y.o. female G1P0 [redacted]w[redacted]d with a history of velamentous insertion of umbilical cord presenting for elevated blood pressure and headache. Yesterday, 9/27, around 3-4pm, the patient developed a 5-6/10 constant headache located at the back of her head. She then checked her blood pressure, which read 150/90s. She also had nausea and one episode of NBNB vomiting yesterday, but she has been able to tolerate PO since then. She tried Tylenol at 10 pm yesterday with no relief. The patient initially woke up this morning with normal blood pressure and no headache, but shortly after her 3-4/10 headache returned, and her blood pressure read 136/96. She last took Tylenol 1 hour before presentation to the MAU with no relief. Denies any current nausea, but she endorses a 4/10 headache. She has a history of headaches in the first trimester that prompted her to present to the MAU, and she was found to have low blood sugar; she denies any further headaches since then. Denies visual disturbances, swelling, fever, chest pain and shortness of breath. There is positive fetal movement. Denies vaginal bleeding and loss of fluids. She believes she may have started having braxton hicks contractions 2-3 weeks ago, where she feels tightness in her stomach a few times a day.   OB History     Gravida  1   Para      Term      Preterm      AB      Living         SAB      IAB      Ectopic      Multiple      Live Births              Past Medical History:  Diagnosis Date   Anxiety    Asthma    as a child   Depression    hx of taking Lexapro, been off it for a while, doing ok   UTI (urinary tract infection)     Past Surgical History:  Procedure Laterality  Date   NO PAST SURGERIES      History reviewed. No pertinent family history.  Social History   Tobacco Use   Smoking status: Never  Vaping Use   Vaping Use: Some days  Substance Use Topics   Alcohol use: No   Drug use: No    Allergies: No Known Allergies  Medications Prior to Admission  Medication Sig Dispense Refill Last Dose   Prenatal Vit-Fe Fumarate-FA (PRENATAL PO) Take by mouth.   03/28/2022    Review of Systems Physical Exam   Blood pressure 116/72, pulse (!) 108, temperature 98.1 F (36.7 C), temperature source Oral, resp. rate 16, height 5\' 2"  (1.575 m), weight 66 kg, last menstrual period 07/22/2021, SpO2 96 %.  Physical Exam  MAU Course  Procedures  MDM Moderate  Assessment and Plan   Samantha Walton is a 21 y.o. female G1P0 [redacted]w[redacted]d with a history of velamentous insertion of umbilical cord presenting for elevated blood pressure and headache since yesterday. Her blood pressure on presentation was 133/76 and she had a benign physical exam. Her constant headache and elevated blood pressure readings are most concerning for  pre-eclampsia; however, her normal blood pressure readings today, as low as 116/72 most recently, makes this less likely. HELLP syndrome should also be ruled out due to her elevated blood pressure.   Headache  The patient has already taken Tylenol 1 hour ago with no relief, so will offer the patient Fioricet for pain, with instructions to have someone else drive her home.   2.  Elevated blood pressure  Her current blood pressure is within normal limits and she is otherwise asymptomatic. Plan to order CBC, CMP and UA to assess for platelet count, LFTs, and protein/creatinine ratio to rule our pre-eclampsia and HELLP syndrome. If positive, plan to arrange repeat blood pressure check with her OB and recommend weekly antepartum monitoring.   Paulo Fruit 03/29/2022, 4:55 PM

## 2022-03-29 NOTE — Telephone Encounter (Signed)
Per chart review patient has been triaged by RN at MAU.

## 2022-03-29 NOTE — Telephone Encounter (Signed)
Patient has been throwing up, having a headache and high blood pressure, would like a nurse to give her a call back.

## 2022-03-29 NOTE — MAU Note (Signed)
Samantha Walton is a 21 y.o. 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 Still has a headache and BP was 136/96 at home.   Onset of complaint: yesterday  Pain score: 3/10, took 500mg  tylenol 60 min ago  Vitals:   03/29/22 1613  BP: 133/76  Pulse: (!) 111  Resp: 16  Temp: 98.1 F (36.7 C)  SpO2: 96%     FHT:+FM, EFM applied in room  Lab orders placed from triage: UA

## 2022-03-29 NOTE — MAU Provider Note (Signed)
History     CSN: [id number]  Arrival date and time: 03/29/22 1547   Event Date/Time   First Provider Initiated Contact with Patient 03/29/22 1643      Chief Complaint  Patient presents with   Headache   Hypertension   HPI   [name] is a 21 y.o.female G1P0 @ [redacted]w[redacted]d here with HA and elevated BP.  Yesterday, 9/27, around 3-4pm, the patient developed a 5-6/10 constant headache located at the back of her head. She then checked her blood pressure, which read 150/90s. She also had nausea and one episode of NBNB vomiting yesterday, but she has been able to tolerate PO since then. She tried Tylenol at 10 pm yesterday with no relief. The patient initially woke up this morning with normal blood pressure and no headache, but shortly after her 3-4/10 headache returned, and her blood pressure read 136/96. She last took Tylenol 1 hour before presentation to the MAU with no relief. Denies any current nausea, but she endorses a 4/10 headache. She has a history of headaches in the first trimester that prompted her to present to the MAU, and she was found to have low blood sugar; she denies any further headaches since then. Denies visual disturbances, swelling, fever, chest pain and shortness of breath. There is positive fetal movement. Denies vaginal bleeding and loss of fluids. She believes she may have started having braxton hicks contractions 2-3 weeks ago, where she feels tightness in her stomach a few times a day.    OB History     Gravida  1   Para      Term      Preterm      AB      Living         SAB      IAB      Ectopic      Multiple      Live Births              Past Medical History:  Diagnosis Date   Anxiety    Asthma    as a child   Depression    hx of taking Lexapro, been off it for a while, doing ok   UTI (urinary tract infection)     Past Surgical History:  Procedure Laterality Date   NO PAST SURGERIES      History reviewed. No pertinent  family history.  Social History   Tobacco Use   Smoking status: Never  Vaping Use   Vaping Use: Some days  Substance Use Topics   Alcohol use: No   Drug use: No    Allergies: No Known Allergies  No medications prior to admission.   Results for orders placed or performed during the hospital encounter of 03/29/22 (from the past 48 hour(s))  Urinalysis, Routine w reflex microscopic Urine, Clean Catch     Status: Abnormal   Collection Time: 03/29/22  3:54 PM  Result Value Ref Range   Color, Urine YELLOW YELLOW   APPearance HAZY (A) CLEAR   Specific Gravity, Urine 1.018 1.005 - 1.030   pH 6.0 5.0 - 8.0   Glucose, UA NEGATIVE NEGATIVE mg/dL   Hgb urine dipstick NEGATIVE NEGATIVE   Bilirubin Urine NEGATIVE NEGATIVE   Ketones, ur NEGATIVE NEGATIVE mg/dL   Protein, ur 30 (A) NEGATIVE mg/dL   Nitrite NEGATIVE NEGATIVE   Leukocytes,Ua NEGATIVE NEGATIVE   RBC / HPF 0-5 0 - 5 RBC/hpf   WBC, UA 0-5 0 -  5 WBC/hpf   Bacteria, UA FEW (A) NONE SEEN   Squamous Epithelial / LPF 0-5 0 - 5   Mucus PRESENT     Comment: Performed at [facility], [address], [place], [place] [address]  Protein / creatinine ratio, urine     Status: Abnormal   Collection Time: 03/29/22  4:08 PM  Result Value Ref Range   Creatinine, Urine 142 mg/dL   Total Protein, Urine 26 mg/dL    Comment: NO NORMAL RANGE ESTABLISHED FOR THIS TEST   Protein Creatinine Ratio 0.18 (H) 0.00 - 0.15 mg/mg[Cre]    Comment: Performed at [facility], [address] [place], [place] [address]  CBC     Status: Abnormal   Collection Time: 03/29/22  5:10 PM  Result Value Ref Range   WBC 9.7 4.0 - 10.5 K/uL   RBC 3.59 (L) 3.87 - 5.11 MIL/uL   Hemoglobin 11.6 (L) 12.0 - 15.0 g/dL   HCT 09.8 (L) 11.9 - 14.7 %   MCV 88.3 80.0 - 100.0 fL   MCH 32.3 26.0 - 34.0 pg   MCHC 36.6 (H) 30.0 - 36.0 g/dL   RDW 82.9 56.2 - 13.0 %   Platelets 283 150 - 400 K/uL   nRBC 0.0 0.0 - 0.2 %    Comment: Performed at [facility], [address], [place], [place] [address]  Comprehensive metabolic panel     Status: Abnormal   Collection Time: 03/29/22  5:10 PM  Result Value Ref Range   Sodium 131 (L) 135 - 145 mmol/L   Potassium 4.3 3.5 - 5.1 mmol/L   Chloride 107 98 - 111 mmol/L   CO2 20 (L) 22 - 32 mmol/L   Glucose, Bld 84 70 - 99 mg/dL    Comment: Glucose reference range applies only to samples taken after fasting for at least 8 hours.   BUN 10 6 - 20 mg/dL   Creatinine, Ser 4.69 0.44 - 1.00 mg/dL   Calcium 8.1 (L) 8.9 - 10.3 mg/dL   Total Protein 6.4 (L) 6.5 - 8.1 g/dL   Albumin 2.8 (L) 3.5 - 5.0 g/dL   AST 41 15 - 41 U/L   ALT 34 0 - 44 U/L   Alkaline Phosphatase 205 (H) 38 - 126 U/L   Total Bilirubin 0.4 0.3 - 1.2 mg/dL   GFR, Estimated >62 >95 mL/min    Comment: (NOTE) Calculated using the CKD-EPI Creatinine Equation (2021)    Anion gap 4 (L) 5 - 15    Comment: Performed at [facility], [address] [place], [place] [address]     Review of Systems  Constitutional:  Negative for fever.  Eyes:  Negative for photophobia and visual disturbance.  Gastrointestinal:  Negative for abdominal pain.  Genitourinary:  Negative for vaginal bleeding.  Neurological:  Positive for headaches.   Physical Exam   Blood pressure 123/78, pulse 97, temperature 98.1 F (36.7 C), temperature source Oral, resp. rate 16, height 5\' 2"  (1.575 m), weight 66 kg, last menstrual period 07/22/2021, SpO2 96 %.  No data found.   Physical Exam Vitals and nursing note reviewed.  Constitutional:      Appearance: She is well-developed.  HENT:     Head: Normocephalic.  Eyes:     Extraocular Movements: Extraocular movements intact.  Musculoskeletal:     Cervical back: Normal range of motion.  Neurological:     Mental Status: She is alert and oriented  to person, place, and time.     GCS: GCS eye subscore is 4. GCS verbal subscore is 5. GCS motor subscore is 6.     Deep Tendon Reflexes: Reflexes normal  (negative clonus).  Psychiatric:        Mood and Affect: Mood normal.    MAU Course  Procedures  MDM  Fetal tracing: baseline 140, 15x15 accels, no decels.  BP's with normal readings in MAU.  Fioricet given. HA down to 1/10 prior to DC home CBC, CMP and PCR reassuring.   Assessment and Plan   A:  1. [redacted] weeks gestation of pregnancy   2. Pregnancy headache in third trimester   3. Elevated BP without diagnosis of hypertension      P:  BP check in the office next week DC home.  Strict pre E precautions.  Return to MAU if symptoms worsen    Mikey Maffett, Artist Pais, NP 04/06/2022 11:36 AM

## 2022-03-31 NOTE — Progress Notes (Unsigned)
   PRENATAL VISIT NOTE  Subjective:  Samantha Walton is a 21 y.o. G1P0 at [redacted]w[redacted]d being seen today for ongoing prenatal care.  She is currently monitored for the following issues for this {Blank single:19197::"high-risk","low-risk"} pregnancy and has Supervision of low-risk first pregnancy on their problem list.  Patient reports {sx:14538}.   .  .   . Denies leaking of fluid.   The following portions of the patient's history were reviewed and updated as appropriate: allergies, current medications, past family history, past medical history, past social history, past surgical history and problem list.   Objective:  There were no vitals filed for this visit.  Fetal Status:           General:  Alert, oriented and cooperative. Patient is in no acute distress.  Skin: Skin is warm and dry. No rash noted.   Cardiovascular: Normal heart rate noted  Respiratory: Normal respiratory effort, no problems with respiration noted  Abdomen: Soft, gravid, appropriate for gestational age.        Pelvic: {Blank single:19197::"Cervical exam performed in the presence of a chaperone","Cervical exam deferred"}        Extremities: Normal range of motion.     Mental Status: Normal mood and affect. Normal behavior. Normal judgment and thought content.   Assessment and Plan:  Pregnancy: G1P0 at [redacted]w[redacted]d There are no diagnoses linked to this encounter. {Blank single:19197::"Term","Preterm"} labor symptoms and general obstetric precautions including but not limited to vaginal bleeding, contractions, leaking of fluid and fetal movement were reviewed in detail with the patient. Please refer to After Visit Summary for other counseling recommendations.   No follow-ups on file.  Future Appointments  Date Time Provider [place]  04/02/2022  2:00 PM [facility] NURSE [facility]  04/03/2022  2:45 PM [facility] NURSE [facility]  04/03/2022  3:00 PM [facility] US1 [facility]  04/05/2022  1:15 PM Starr Lake,  CNM [facility]  04/12/2022 10:15 AM Deloris Ping, CNM [facility]  04/19/2022 10:15 AM Shelda Pal, DO [facility]  04/26/2022  9:15 AM Donnamae Jude, MD [facility]  05/01/2022  2:30 PM [facility] NURSE [facility]  05/01/2022  2:45 PM [facility] US5 [facility]  05/11/2022 10:45 AM [facility]    Starr Lake, CNM

## 2022-04-02 ENCOUNTER — Ambulatory Visit (INDEPENDENT_AMBULATORY_CARE_PROVIDER_SITE_OTHER): Payer: Medicaid Other | Admitting: *Deleted

## 2022-04-02 ENCOUNTER — Other Ambulatory Visit: Payer: Self-pay

## 2022-04-02 VITALS — BP 124/86 | HR 122 | Wt 144.2 lb

## 2022-04-02 DIAGNOSIS — Z3400 Encounter for supervision of normal first pregnancy, unspecified trimester: Secondary | ICD-10-CM

## 2022-04-02 DIAGNOSIS — L299 Pruritus, unspecified: Secondary | ICD-10-CM

## 2022-04-02 DIAGNOSIS — Z0130 Encounter for examination of blood pressure without abnormal findings: Secondary | ICD-10-CM

## 2022-04-02 NOTE — Progress Notes (Addendum)
States here for BP check. BP 124/86.  States today woke up with headache = 3,4, not relieved by nap. Has not taken any medicine. No edema noted. Denies vistual disturbances.Also c/o itching of her hands, arms, legs, feet x 2 days. No rash noted. States no new soaps, meds,etc. Also asked if can be taken out of work. Discussed with Dr. Harolyn Rutherford and reviewed vital signs, symptoms , MAu visit. Labs ordered for Bile acids,CMP. Has next ob visit Friday. Informed her she can discuss using her FMLA with her employer. Also reviewed pre-eclampsia symtoms. She voices understanding.  Staci Acosta   Patient was assessed and managed by nursing staff during this encounter. I have reviewed the chart and agree with the documentation and plan. I have also made any necessary editorial changes.  Verita Schneiders, MD 04/02/2022 5:46 PM

## 2022-04-03 ENCOUNTER — Ambulatory Visit: Payer: Medicaid Other | Attending: Maternal & Fetal Medicine

## 2022-04-03 ENCOUNTER — Ambulatory Visit: Payer: Medicaid Other | Admitting: *Deleted

## 2022-04-03 ENCOUNTER — Encounter: Payer: Self-pay | Admitting: *Deleted

## 2022-04-03 DIAGNOSIS — O43123 Velamentous insertion of umbilical cord, third trimester: Secondary | ICD-10-CM | POA: Diagnosis present

## 2022-04-03 DIAGNOSIS — Z3A36 36 weeks gestation of pregnancy: Secondary | ICD-10-CM | POA: Diagnosis not present

## 2022-04-03 DIAGNOSIS — O35BXX0 Maternal care for other (suspected) fetal abnormality and damage, fetal cardiac anomalies, not applicable or unspecified: Secondary | ICD-10-CM

## 2022-04-04 LAB — COMPREHENSIVE METABOLIC PANEL
ALT: 28 IU/L (ref 0–32)
AST: 40 IU/L (ref 0–40)
Albumin/Globulin Ratio: 1.3 (ref 1.2–2.2)
Albumin: 4 g/dL (ref 4.0–5.0)
Alkaline Phosphatase: 261 IU/L — ABNORMAL HIGH (ref 44–121)
BUN/Creatinine Ratio: 20 (ref 9–23)
BUN: 14 mg/dL (ref 6–20)
Bilirubin Total: <0.2 mg/dL (ref 0.0–1.2)
CO2: 17 mmol/L — ABNORMAL LOW (ref 20–29)
Calcium: 9.4 mg/dL (ref 8.7–10.2)
Chloride: 101 mmol/L (ref 96–106)
Creatinine, Ser: 0.7 mg/dL (ref 0.57–1.00)
Globulin, Total: 3.1 g/dL (ref 1.5–4.5)
Glucose: 70 mg/dL (ref 70–99)
Potassium: 4.5 mmol/L (ref 3.5–5.2)
Sodium: 136 mmol/L (ref 134–144)
Total Protein: 7.1 g/dL (ref 6.0–8.5)
eGFR: 126 mL/min/{1.73_m2} (ref >59)

## 2022-04-04 LAB — BILE ACIDS, TOTAL: Bile Acids Total: 7.5 umol/L (ref 0.0–10.0)

## 2022-04-05 ENCOUNTER — Other Ambulatory Visit (HOSPITAL_COMMUNITY)
Admission: RE | Admit: 2022-04-05 | Discharge: 2022-04-05 | Disposition: A | Discharge status: Home / Self Care | Payer: Medicaid Other | Source: Ambulatory Visit | Attending: Student | Admitting: Student

## 2022-04-05 ENCOUNTER — Other Ambulatory Visit: Payer: Self-pay

## 2022-04-05 ENCOUNTER — Ambulatory Visit (INDEPENDENT_AMBULATORY_CARE_PROVIDER_SITE_OTHER): Payer: Medicaid Other | Admitting: Student

## 2022-04-05 VITALS — BP 123/88 | HR 101 | Wt 145.3 lb

## 2022-04-05 DIAGNOSIS — Z3493 Encounter for supervision of normal pregnancy, unspecified, third trimester: Secondary | ICD-10-CM | POA: Insufficient documentation

## 2022-04-05 DIAGNOSIS — Z3A36 36 weeks gestation of pregnancy: Secondary | ICD-10-CM | POA: Diagnosis present

## 2022-04-05 DIAGNOSIS — Z3403 Encounter for supervision of normal first pregnancy, third trimester: Secondary | ICD-10-CM

## 2022-04-05 NOTE — Addendum Note (Signed)
Addended by: Madalyn Rob D on: 04/05/2022 02:13 PM   Modules accepted: Orders

## 2022-04-06 LAB — CERVICOVAGINAL ANCILLARY ONLY
Chlamydia: NEGATIVE
Comment: NEGATIVE
Comment: NORMAL
Neisseria Gonorrhea: NEGATIVE

## 2022-04-09 LAB — CULTURE, BETA STREP (GROUP B ONLY): Strep Gp B Culture: NEGATIVE

## 2022-04-12 ENCOUNTER — Other Ambulatory Visit: Payer: Self-pay

## 2022-04-12 ENCOUNTER — Ambulatory Visit (INDEPENDENT_AMBULATORY_CARE_PROVIDER_SITE_OTHER): Payer: Medicaid Other | Admitting: Certified Nurse Midwife

## 2022-04-12 VITALS — BP 123/88 | HR 107 | Wt 146.5 lb

## 2022-04-12 DIAGNOSIS — Z3400 Encounter for supervision of normal first pregnancy, unspecified trimester: Secondary | ICD-10-CM

## 2022-04-12 DIAGNOSIS — Z3A37 37 weeks gestation of pregnancy: Secondary | ICD-10-CM

## 2022-04-12 DIAGNOSIS — Z3403 Encounter for supervision of normal first pregnancy, third trimester: Secondary | ICD-10-CM

## 2022-04-12 DIAGNOSIS — O43123 Velamentous insertion of umbilical cord, third trimester: Secondary | ICD-10-CM

## 2022-04-12 DIAGNOSIS — F419 Anxiety disorder, unspecified: Secondary | ICD-10-CM

## 2022-04-12 NOTE — Progress Notes (Signed)
   PRENATAL VISIT NOTE  Subjective:  Samantha Walton is a 21 y.o. G1P0 at [redacted]w[redacted]d being seen today for ongoing prenatal care.  She is currently monitored for the following issues for this low-risk pregnancy and has Supervision of low-risk first pregnancy on their problem list.  Patient reports occasional contractions, but states that they are inconsistent. .  Contractions: Irritability. Vag. Bleeding: None.  Movement: Present. Denies leaking of fluid.   The following portions of the patient's history were reviewed and updated as appropriate: allergies, current medications, past family history, past medical history, past social history, past surgical history and problem list.   Objective:   Vitals:   04/12/22 1026  BP: 123/88  Pulse: (!) 107  Weight: 146 lb 8 oz (66.5 kg)    Fetal Status: Fetal Heart Rate (bpm): 135 Fundal Height: 37 cm Movement: Present     General:  Alert, oriented and cooperative. Patient is in no acute distress.  Skin: Skin is warm and dry. No rash noted.   Cardiovascular: Normal heart rate noted  Respiratory: Normal respiratory effort, no problems with respiration noted  Abdomen: Soft, gravid, appropriate for gestational age.  Pain/Pressure: Absent     Pelvic: Cervical exam deferred        Extremities: Normal range of motion.     Mental Status: Normal mood and affect. Normal behavior. Normal judgment and thought content.   Assessment and Plan:  Pregnancy: G1P0 at [redacted]w[redacted]d 1. Encounter for supervision of low-risk first pregnancy, antepartum - Patient feeling frequent and vigorous fetal movement.   2. [redacted] weeks gestation of pregnancy - GBS collected at last visit. GBS Negative   3. 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 Discussed that velamentous cord insertion does not meet early induction criteria, but if desired patient may be offered an election IOL at  39 weeks of pregnancy.   4. Anxiety - Patient currently being see by [facility] with Roselyn Reef, and has noticed significant improvements. She has future appointments scheduled.   Term labor symptoms and general obstetric precautions including but not limited to vaginal bleeding, contractions, leaking of fluid and fetal movement were reviewed in detail with the patient. Please refer to After Visit Summary for other counseling recommendations.   Return in about 1 week (around 04/19/2022) for LOB.  Future Appointments  Date Time Provider [place]  04/19/2022 10:15 AM Shelda Pal, DO [facility]  04/26/2022  8:55 AM Donnamae Jude, MD [facility]  05/11/2022 10:45 AM [facility] [place] (Isaias Sakai) Rollene Rotunda, MSN, [place] for [facility]  04/12/22 1:14 PM

## 2022-04-16 ENCOUNTER — Inpatient Hospital Stay (HOSPITAL_COMMUNITY)
Admission: AD | Admit: 2022-04-16 | Discharge: 2022-04-16 | Disposition: A | Discharge status: Home / Self Care | Payer: Medicaid Other | Source: Home / Self Care | Attending: Obstetrics and Gynecology | Admitting: Obstetrics and Gynecology

## 2022-04-16 ENCOUNTER — Encounter (HOSPITAL_COMMUNITY): Payer: Self-pay | Admitting: Obstetrics and Gynecology

## 2022-04-16 DIAGNOSIS — Z3A38 38 weeks gestation of pregnancy: Secondary | ICD-10-CM | POA: Insufficient documentation

## 2022-04-16 DIAGNOSIS — Z3403 Encounter for supervision of normal first pregnancy, third trimester: Secondary | ICD-10-CM

## 2022-04-16 DIAGNOSIS — O471 False labor at or after 37 completed weeks of gestation: Secondary | ICD-10-CM | POA: Insufficient documentation

## 2022-04-16 NOTE — MAU Provider Note (Addendum)
Ms. DEIDRA SPEASE is a G1P0 at [redacted]w[redacted]d seen in MAU for labor. RN labor check, not seen by provider. SVE by RN Dilation: 1 Effacement (%): 50 Station: -3 Exam by:: Zella Ball, RN   NST - FHR: 125 bpm / moderate variability / accels present / decels absent / TOCO: regular every 8-10 mins   Plan:  D/C home with labor precautions Keep scheduled appt with Dr. Simmie Davies on 04/19/2022  Lowry Ram, MD  04/16/2022 6:36 PM    Attestation of Supervision of Student:  I confirm that I have verified the information documented in the  resident 's note and that I have also personally reperformed the history, physical exam and all medical decision making activities.  I have verified that all services and findings are accurately documented in this student's note; and I agree with management and plan as outlined in the documentation. I have also made any necessary editorial changes.   Laury Deep, [place] for [employer], [employer] 04/16/2022 6:51 PM

## 2022-04-16 NOTE — MAU Note (Signed)
...  Samantha Walton is a 21 y.o. at [redacted]w[redacted]d here in MAU reporting: CTX since yesterday that are less than 10 minutes apart. She reports she is unsure of how far apart they are exactly. Denies VB or LOF. +FM.   Onset of complaint: Yesterday Pain score: 5/10 lower abdomen   FHT: 134 doppler Lab orders placed from triage:  MAU Labor

## 2022-04-17 ENCOUNTER — Encounter (HOSPITAL_COMMUNITY): Payer: Self-pay | Admitting: Obstetrics & Gynecology

## 2022-04-17 ENCOUNTER — Inpatient Hospital Stay (HOSPITAL_COMMUNITY)
Admission: AD | Admit: 2022-04-17 | Discharge: 2022-04-19 | DRG: 807 | Disposition: A | Discharge status: Home / Self Care | Payer: Medicaid Other | Attending: Family Medicine | Admitting: Family Medicine

## 2022-04-17 ENCOUNTER — Inpatient Hospital Stay (HOSPITAL_COMMUNITY): Payer: Medicaid Other | Admitting: Anesthesiology

## 2022-04-17 DIAGNOSIS — O26893 Other specified pregnancy related conditions, third trimester: Secondary | ICD-10-CM | POA: Diagnosis present

## 2022-04-17 DIAGNOSIS — Z0289 Encounter for other administrative examinations: Secondary | ICD-10-CM

## 2022-04-17 DIAGNOSIS — Z3A38 38 weeks gestation of pregnancy: Secondary | ICD-10-CM | POA: Diagnosis not present

## 2022-04-17 DIAGNOSIS — Z3403 Encounter for supervision of normal first pregnancy, third trimester: Principal | ICD-10-CM

## 2022-04-17 DIAGNOSIS — O43123 Velamentous insertion of umbilical cord, third trimester: Secondary | ICD-10-CM | POA: Diagnosis present

## 2022-04-17 LAB — CBC
HCT: 36.4 % (ref 36.0–46.0)
Hemoglobin: 12.8 g/dL (ref 12.0–15.0)
MCH: 31.2 pg (ref 26.0–34.0)
MCHC: 35.2 g/dL (ref 30.0–36.0)
MCV: 88.8 fL (ref 80.0–100.0)
Platelets: 264 10*3/uL (ref 150–400)
RBC: 4.1 MIL/uL (ref 3.87–5.11)
RDW: 13.2 % (ref 11.5–15.5)
WBC: 11.1 10*3/uL — ABNORMAL HIGH (ref 4.0–10.5)
nRBC: 0 % (ref 0.0–0.2)

## 2022-04-17 LAB — TYPE AND SCREEN
ABO/RH(D): O POS
Antibody Screen: NEGATIVE

## 2022-04-17 LAB — POCT FERN TEST: POCT Fern Test: POSITIVE

## 2022-04-17 MED ORDER — FENTANYL CITRATE (PF) 100 MCG/2ML IJ SOLN
100.0000 ug | INTRAMUSCULAR | Status: DC | PRN
  Administered 2022-04-17 – 2022-04-18 (×2): 100 ug via INTRAVENOUS
  Filled 2022-04-17 (×2): qty 2

## 2022-04-17 MED ORDER — LIDOCAINE HCL (PF) 1 % IJ SOLN: 30.0000 mL | INTRAMUSCULAR | Status: DC | PRN

## 2022-04-17 MED ORDER — LACTATED RINGERS IV SOLN: INTRAVENOUS | Status: DC

## 2022-04-17 MED ORDER — OXYTOCIN-SODIUM CHLORIDE 30-0.9 UT/500ML-% IV SOLN
1.0000 m[IU]/min | INTRAVENOUS | Status: DC
  Administered 2022-04-17: 2 m[IU]/min via INTRAVENOUS
  Filled 2022-04-17: qty 500

## 2022-04-17 MED ORDER — ACETAMINOPHEN 325 MG PO TABS
650.0000 mg | ORAL_TABLET | ORAL | Status: DC | PRN
  Administered 2022-04-18: 650 mg via ORAL
  Filled 2022-04-17: qty 2

## 2022-04-17 MED ORDER — OXYCODONE-ACETAMINOPHEN 5-325 MG PO TABS: 1.0000 | ORAL_TABLET | ORAL | Status: DC | PRN

## 2022-04-17 MED ORDER — LACTATED RINGERS IV SOLN: 500.0000 mL | Freq: Once | INTRAVENOUS | Status: DC

## 2022-04-17 MED ORDER — PHENYLEPHRINE 80 MCG/ML (10ML) SYRINGE FOR IV PUSH (FOR BLOOD PRESSURE SUPPORT): 80.0000 ug | PREFILLED_SYRINGE | INTRAVENOUS | Status: DC | PRN

## 2022-04-17 MED ORDER — SOD CITRATE-CITRIC ACID 500-334 MG/5ML PO SOLN: 30.0000 mL | ORAL | Status: DC | PRN

## 2022-04-17 MED ORDER — ONDANSETRON HCL 4 MG/2ML IJ SOLN
4.0000 mg | Freq: Four times a day (QID) | INTRAMUSCULAR | Status: DC | PRN
  Administered 2022-04-18: 4 mg via INTRAVENOUS
  Filled 2022-04-17: qty 2

## 2022-04-17 MED ORDER — OXYCODONE-ACETAMINOPHEN 5-325 MG PO TABS: 2.0000 | ORAL_TABLET | ORAL | Status: DC | PRN

## 2022-04-17 MED ORDER — FENTANYL-BUPIVACAINE-NACL 0.5-0.125-0.9 MG/250ML-% EP SOLN
12.0000 mL/h | EPIDURAL | Status: DC | PRN
  Administered 2022-04-17: 12 mL/h via EPIDURAL
  Filled 2022-04-17: qty 250

## 2022-04-17 MED ORDER — LIDOCAINE-EPINEPHRINE (PF) 1.5 %-1:200000 IJ SOLN
INTRAMUSCULAR | Status: DC | PRN
  Administered 2022-04-17: 3 mL via EPIDURAL

## 2022-04-17 MED ORDER — OXYTOCIN-SODIUM CHLORIDE 30-0.9 UT/500ML-% IV SOLN: 2.5000 [IU]/h | INTRAVENOUS | Status: DC

## 2022-04-17 MED ORDER — EPHEDRINE 5 MG/ML INJ: 10.0000 mg | INTRAVENOUS | Status: DC | PRN

## 2022-04-17 MED ORDER — LACTATED RINGERS IV SOLN: 500.0000 mL | INTRAVENOUS | Status: DC | PRN

## 2022-04-17 MED ORDER — TERBUTALINE SULFATE 1 MG/ML IJ SOLN: 0.2500 mg | Freq: Once | INTRAMUSCULAR | Status: DC | PRN

## 2022-04-17 MED ORDER — PHENYLEPHRINE 80 MCG/ML (10ML) SYRINGE FOR IV PUSH (FOR BLOOD PRESSURE SUPPORT)
80.0000 ug | PREFILLED_SYRINGE | INTRAVENOUS | Status: DC | PRN
  Filled 2022-04-17: qty 10

## 2022-04-17 MED ORDER — DIPHENHYDRAMINE HCL 50 MG/ML IJ SOLN: 12.5000 mg | INTRAMUSCULAR | Status: DC | PRN

## 2022-04-17 MED ORDER — OXYTOCIN BOLUS FROM INFUSION
333.0000 mL | Freq: Once | INTRAVENOUS | Status: AC
  Administered 2022-04-18: 333 mL via INTRAVENOUS

## 2022-04-17 NOTE — Anesthesia Procedure Notes (Signed)
Epidural Patient location during procedure: OB Start time: 04/17/2022 5:18 PM End time: 04/17/2022 5:33 PM  Staffing Anesthesiologist: Nolon Nations, MD Performed: anesthesiologist   Preanesthetic Checklist Completed: patient identified, IV checked, risks and benefits discussed, monitors and equipment checked, pre-op evaluation and timeout performed  Epidural Patient position: sitting Prep: DuraPrep and site prepped and draped Patient monitoring: heart rate, continuous pulse ox and blood pressure Approach: midline Location: L2-L3 Injection technique: LOR air and LOR saline  Needle:  Needle type: Tuohy  Needle gauge: 17 G Needle length: 9 cm Needle insertion depth: 4 cm Catheter type: closed end flexible Catheter size: 19 Gauge Catheter at skin depth: 10 cm Test dose: negative  Assessment Sensory level: T8 Events: blood not aspirated, injection not painful, no injection resistance, no paresthesia and negative IV test  Additional Notes Reason for block:procedure for pain

## 2022-04-17 NOTE — Progress Notes (Signed)
Patient ID: Samantha Walton, female   DOB: 2000-08-04, 21 y.o.   MRN: [id number] Samantha Walton is a 21 y.o. G1P0 at [redacted]w[redacted]d admitted for early labor and SROM  Subjective: no complaints and comfortable with epidural  Objective: BP 106/70   Pulse 80   Temp 98.1 F (36.7 C) (Oral)   Resp 15   Ht 5\' 2"  (1.575 m)   Wt 66.7 kg   LMP 07/22/2021 (Exact Date)   SpO2 100%   BMI 26.89 kg/m  Total I/O In: -  Out: 920 [Urine:920]  FHR baseline 125 bpm, Variability: moderate, Accelerations:present, Decelerations:  Present  variables just started w/ position change to Rt exaggerated sims (was feeling pain on Rt side) Toco: q 2-5 mins   SVE:   Dilation: 8 Effacement (%): 90 Station: Plus 1 Exam by:: Doree Fudge, CNM  Pitocin @ 6 mu/min  Labs: Lab Results  Component Value Date   WBC 11.1 (H) 04/17/2022   HGB 12.8 04/17/2022   HCT 36.4 04/17/2022   MCV 88.8 04/17/2022   PLT 264 04/17/2022    Assessment / Plan: Augmentation of labor, progressing normally on pitocin, currently at 6, variables since turning to Rt exaggerated sims (d/t pain on Rt side), will monitor and place IUPC/amnio if needed  Labor: active Fetal Wellbeing:  Category II Pain Control:  epidural Pre-eclampsia: N/A I/D:  GBS neg Anticipated MOD: NSVB  Samantha Walton CNM, WHNP-BC 04/17/2022, 9:00 PM

## 2022-04-17 NOTE — Progress Notes (Signed)
LABOR PROGRESS NOTE  Samantha Walton is a 21 y.o. G1P0 at [redacted]w[redacted]d  admitted for SOL  Subjective: Patient is comfortable after receiving epidural   Objective: BP 128/76   Pulse 90   Temp 98 F (36.7 C) (Oral)   Resp 16   Ht 5\' 2"  (1.575 m)   Wt 66.7 kg   LMP 07/22/2021 (Exact Date)   SpO2 100%   BMI 26.89 kg/m  or  Vitals:   04/17/22 1755 04/17/22 1800 04/17/22 1826 04/17/22 1830  BP: 132/65 125/74  128/76  Pulse: 87 81  90  Resp: 18 18 16    Temp:  98 F (36.7 C)    TempSrc:  Oral    SpO2: 100% 100% 100% 100%  Weight:      Height:        SVE Dilation: 4.5 Effacement (%): 90 Cervical Position: Middle Station: -2 Presentation: Vertex Exam by:: Dr. Gwendolyn Lima FHT: baseline rate 130, moderate varibility, +acel, -decel Toco: every 5 minutes   Labs: Lab Results  Component Value Date   WBC 11.1 (H) 04/17/2022   HGB 12.8 04/17/2022   HCT 36.4 04/17/2022   MCV 88.8 04/17/2022   PLT 264 04/17/2022    Patient Active Problem List   Diagnosis Date Noted   Spontaneous onset of labor after 37 but before 39 completed weeks gestation with delivery by planned cesarean section 04/17/2022   Supervision of low-risk first pregnancy 10/11/2021    Assessment / Plan: 21 y.o. G1P0 at [redacted]w[redacted]d here for SOL.   Labor: Starting pitocin, continue to monitor Fetal Wellbeing:  Cat 1  Pain Control:  Epidural  Anticipated MOD:  SVD  Lowry Ram, MD  PGY-1, [facility]  04/17/2022, 6:43 PM

## 2022-04-17 NOTE — Anesthesia Preprocedure Evaluation (Signed)
Anesthesia Evaluation  Patient identified by MRN, date of birth, ID band Patient awake    Reviewed: Allergy & Precautions, Patient's Chart, lab work & pertinent test results  Airway Mallampati: II  TM Distance: >3 FB Neck ROM: Full    Dental  (+) Dental Advisory Given, Teeth Intact   Pulmonary asthma ,    Pulmonary exam normal breath sounds clear to auscultation       Cardiovascular negative cardio ROS Normal cardiovascular exam Rhythm:Regular Rate:Normal     Neuro/Psych PSYCHIATRIC DISORDERS Anxiety Depression negative neurological ROS     GI/Hepatic negative GI ROS, Neg liver ROS,   Endo/Other  negative endocrine ROS  Renal/GU negative Renal ROS     Musculoskeletal negative musculoskeletal ROS (+)   Abdominal   Peds  Hematology negative hematology ROS (+)   Anesthesia Other Findings   Reproductive/Obstetrics (+) Pregnancy                             Anesthesia Physical Anesthesia Plan  ASA: 2  Anesthesia Plan: Epidural   Post-op Pain Management:    Induction:   PONV Risk Score and Plan:   Airway Management Planned:   Additional Equipment:   Intra-op Plan:   Post-operative Plan:   Informed Consent: I have reviewed the patients History and Physical, chart, labs and discussed the procedure including the risks, benefits and alternatives for the proposed anesthesia with the patient or authorized representative who has indicated his/her understanding and acceptance.       Plan Discussed with:   Anesthesia Plan Comments:         Anesthesia Quick Evaluation

## 2022-04-17 NOTE — H&P (Addendum)
OBSTETRIC ADMISSION HISTORY AND PHYSICAL  Samantha Walton is a 21 y.o. female G1P0 with IUP at [redacted]w[redacted]d by LMP presenting for SOL. She reports +FMs, No LOF, no VB, no blurry vision, headaches or peripheral edema, and RUQ pain.  She plans on breast feeding. She request depo for birth control. She received her prenatal care at  [facility]: By LMP --->  Estimated Date of Delivery: 04/28/22  Sono:    @[redacted]w[redacted]d , CWD, normal anatomy, cephalic presentation, posterior lie, 2665g, 26% EFW   Prenatal History/Complications:  Echogenic intracardiac focus of the heart  Velamentous cord insertion   Past Medical History: Past Medical History:  Diagnosis Date   Anxiety    Asthma    as a child   Depression    hx of taking Lexapro, been off it for a while, doing ok   UTI (urinary tract infection)     Past Surgical History: Past Surgical History:  Procedure Laterality Date   NO PAST SURGERIES      Obstetrical History: OB History     Gravida  1   Para      Term      Preterm      AB      Living         SAB      IAB      Ectopic      Multiple      Live Births              Social History Social History   Socioeconomic History   Marital status: Single    Spouse name: Not on file   Number of children: Not on file   Years of education: Not on file   Highest education level: Not on file  Occupational History   Not on file  Tobacco Use   Smoking status: Never   Smokeless tobacco: Not on file  Vaping Use   Vaping Use: Some days  Substance and Sexual Activity   Alcohol use: No   Drug use: No   Sexual activity: Yes  Other Topics Concern   Not on file  Social History Narrative   Not on file   Social Determinants of Health   Financial Resource Strain: Not on file  Food Insecurity: No Food Insecurity (03/22/2022)   Hunger Vital Sign    Worried About Running Out of Food in the Last Year: Never true    Ran Out of Food in the Last Year: Never true   Transportation Needs: No Transportation Needs (03/22/2022)   PRAPARE - Hydrologist (Medical): No    Lack of Transportation (Non-Medical): No  Physical Activity: Not on file  Stress: Not on file  Social Connections: Not on file    Family History: History reviewed. No pertinent family history.  Allergies: No Known Allergies  Medications Prior to Admission  Medication Sig Dispense Refill Last Dose   Prenatal Vit-Fe Fumarate-FA (PRENATAL PO) Take by mouth.   04/16/2022     Review of Systems   All systems reviewed and negative except as stated in HPI  Blood pressure 132/87, pulse (!) 101, temperature 98.1 F (36.7 C), resp. rate 17, height 5\' 2"  (1.575 m), weight 65.8 kg, last menstrual period 07/22/2021, SpO2 98 %. General appearance: alert and cooperative, comfortable Lungs: clear to auscultation bilaterally Heart: regular rate and rhythm Abdomen: soft, non-tender; bowel sounds normal Extremities: Homans sign is negative, no sign of DVT Fetal monitoringBaseline:  135 bpm, moderate variability, + accels, - decels Uterine activity contractions every 3-4 minutes  Dilation: 1.5 Effacement (%): 90 Station: -2 Exam by:: weston,rn   Prenatal labs: ABO, Rh: O/Positive/-- (04/24 1625) Antibody: Negative (04/24 1625) Rubella: 1.66 (04/24 1625) RPR: Non Reactive (08/03 0857)  HBsAg: Negative (04/24 1625)  HIV: Non Reactive (08/03 0857)  GBS: Negative/-- (10/05 1653)  1 hr Glucola wnl Genetic screening  low risk  Anatomy US EIF, velamentous cord   Prenatal Transfer Tool  Maternal Diabetes: No Genetic Screening: Normal Maternal Ultrasounds/Referrals: Isolated EIF (echogenic intracardiac focus) Fetal Ultrasounds or other Referrals:  Referred to Materal Fetal Medicine  Maternal Substance Abuse:  No Significant Maternal Medications:  None Significant Maternal Lab Results:  None Number of Prenatal Visits:greater than 3 verified prenatal  visits Other Comments:  None  Results for orders placed or performed during the hospital encounter of 04/17/22 (from the past 24 hour(s))  POCT fern test   Collection Time: 04/17/22 12:08 PM  Result Value Ref Range   POCT Fern Test Positive = ruptured amniotic membanes     Patient Active Problem List   Diagnosis Date Noted   Supervision of low-risk first pregnancy 10/11/2021    Assessment/Plan:  CREASIE LACOSSE is a 21 y.o. G1P0 at [redacted]w[redacted]d here for SROM and SOL.   #Labor: FB, start pitocin, continue to monitor  #Pain: Early, will continue to monitor  #FWB: Cat 1  #ID:  GBS neg #MOF: Breast #MOC:Depo #Circ:  Yes  Lockie Mola, MD  04/17/2022, 12:18 PM  I spoke with and examined patient and agree with resident/PA-S/MS/SNM's note and plan of care.  Cheral Marker, CNM, [facility] 04/17/2022 9:07 PM

## 2022-04-17 NOTE — MAU Note (Signed)
.  Samantha Walton is a 21 y.o. at [redacted]w[redacted]d here in MAU reporting: ROM at 0930, contractions that have been going on for 24 hours worsened after ROM. Reports positive fetal movement.  Onset of complaint: 0930 Pain score: 8/10 Vitals:   04/17/22 1139  BP: 132/87  Pulse: (!) 101  Resp: 17  Temp: 98.1 F (36.7 C)  SpO2: 98%     FHT:140 Lab orders placed from triage:

## 2022-04-18 ENCOUNTER — Encounter (HOSPITAL_COMMUNITY): Payer: Self-pay | Admitting: Family Medicine

## 2022-04-18 DIAGNOSIS — Z3A38 38 weeks gestation of pregnancy: Secondary | ICD-10-CM

## 2022-04-18 DIAGNOSIS — O43123 Velamentous insertion of umbilical cord, third trimester: Secondary | ICD-10-CM

## 2022-04-18 LAB — RPR: RPR Ser Ql: NONREACTIVE

## 2022-04-18 MED ORDER — MISOPROSTOL 200 MCG PO TABS: 400.0000 ug | ORAL_TABLET | Freq: Once | ORAL | Status: AC

## 2022-04-18 MED ORDER — ZOLPIDEM TARTRATE 5 MG PO TABS: 5.0000 mg | ORAL_TABLET | Freq: Every evening | ORAL | Status: DC | PRN

## 2022-04-18 MED ORDER — ONDANSETRON HCL 4 MG/2ML IJ SOLN: 4.0000 mg | INTRAMUSCULAR | Status: DC | PRN

## 2022-04-18 MED ORDER — OXYCODONE HCL 5 MG PO TABS: 10.0000 mg | ORAL_TABLET | ORAL | Status: DC | PRN

## 2022-04-18 MED ORDER — WITCH HAZEL-GLYCERIN EX PADS: 1.0000 | MEDICATED_PAD | CUTANEOUS | Status: DC | PRN

## 2022-04-18 MED ORDER — DIBUCAINE (PERIANAL) 1 % EX OINT: 1.0000 | TOPICAL_OINTMENT | CUTANEOUS | Status: DC | PRN

## 2022-04-18 MED ORDER — ONDANSETRON HCL 4 MG PO TABS: 4.0000 mg | ORAL_TABLET | ORAL | Status: DC | PRN

## 2022-04-18 MED ORDER — SIMETHICONE 80 MG PO CHEW: 80.0000 mg | CHEWABLE_TABLET | ORAL | Status: DC | PRN

## 2022-04-18 MED ORDER — DIPHENHYDRAMINE HCL 25 MG PO CAPS: 25.0000 mg | ORAL_CAPSULE | Freq: Four times a day (QID) | ORAL | Status: DC | PRN

## 2022-04-18 MED ORDER — TETANUS-DIPHTH-ACELL PERTUSSIS 5-2.5-18.5 LF-MCG/0.5 IM SUSY: 0.5000 mL | PREFILLED_SYRINGE | Freq: Once | INTRAMUSCULAR | Status: DC

## 2022-04-18 MED ORDER — MISOPROSTOL 200 MCG PO TABS
ORAL_TABLET | ORAL | Status: AC
  Administered 2022-04-18: 400 ug via BUCCAL
  Filled 2022-04-18: qty 2

## 2022-04-18 MED ORDER — PRENATAL MULTIVITAMIN CH
1.0000 | ORAL_TABLET | Freq: Every day | ORAL | Status: DC
  Administered 2022-04-18 – 2022-04-19 (×2): 1 via ORAL
  Filled 2022-04-18 (×2): qty 1

## 2022-04-18 MED ORDER — TRANEXAMIC ACID-NACL 1000-0.7 MG/100ML-% IV SOLN
INTRAVENOUS | Status: AC
  Administered 2022-04-18: 1000 mg via INTRAVENOUS
  Filled 2022-04-18: qty 100

## 2022-04-18 MED ORDER — TRANEXAMIC ACID-NACL 1000-0.7 MG/100ML-% IV SOLN: 1000.0000 mg | Freq: Once | INTRAVENOUS | Status: AC

## 2022-04-18 MED ORDER — IBUPROFEN 600 MG PO TABS
600.0000 mg | ORAL_TABLET | Freq: Four times a day (QID) | ORAL | Status: DC
  Administered 2022-04-18 – 2022-04-19 (×6): 600 mg via ORAL
  Filled 2022-04-18 (×6): qty 1

## 2022-04-18 MED ORDER — ACETAMINOPHEN 325 MG PO TABS
650.0000 mg | ORAL_TABLET | ORAL | Status: DC | PRN
  Administered 2022-04-18: 650 mg via ORAL
  Filled 2022-04-18: qty 2

## 2022-04-18 MED ORDER — BENZOCAINE-MENTHOL 20-0.5 % EX AERO: 1.0000 | INHALATION_SPRAY | CUTANEOUS | Status: DC | PRN

## 2022-04-18 MED ORDER — OXYCODONE HCL 5 MG PO TABS: 5.0000 mg | ORAL_TABLET | ORAL | Status: DC | PRN

## 2022-04-18 MED ORDER — COCONUT OIL OIL: 1.0000 | TOPICAL_OIL | Status: DC | PRN

## 2022-04-18 MED ORDER — SENNOSIDES-DOCUSATE SODIUM 8.6-50 MG PO TABS
2.0000 | ORAL_TABLET | Freq: Every day | ORAL | Status: DC
  Administered 2022-04-19: 2 via ORAL
  Filled 2022-04-18: qty 2

## 2022-04-18 NOTE — Anesthesia Postprocedure Evaluation (Signed)
Anesthesia Post Note  Patient: Samantha Walton  Procedure(s) Performed: AN AD HOC LABOR EPIDURAL     Patient location during evaluation: Mother Baby Anesthesia Type: Epidural Level of consciousness: awake and alert Pain management: pain level controlled Vital Signs Assessment: post-procedure vital signs reviewed and stable Respiratory status: spontaneous breathing, nonlabored ventilation and respiratory function stable Cardiovascular status: stable Postop Assessment: no headache, no backache and epidural receding Anesthetic complications: no   No notable events documented.  Last Vitals:  Vitals:   04/18/22 0300 04/18/22 0400  BP: 99/89 124/72  Pulse: 90 92  Resp: 18 16  Temp: 37.7 C 37.4 C  SpO2: 100% 100%    Last Pain:  Vitals:   04/18/22 0400  TempSrc: Oral  PainSc: 0-No pain   Pain Goal:                   [name]

## 2022-04-18 NOTE — Lactation Note (Signed)
This note was copied from a baby's chart. Lactation Consultation Note  Patient Name: Samantha Walton CLEXN'T Date: 04/18/2022 Reason for consult: Follow-up assessment;Early term 37-38.6wks;Primapara;1st time breastfeeding Age:21 years   P1: Early term infant at 38+4 weeks Feeding preference: Breast   "Samantha Walton" was swaddled and asleep in the bassinet when I arrived.  It had been 3-1/2 hours since birth parent attempted to latch.  Baby has not yet latched effectively.  He remains sleepy.  Offered to assist with waking and latching; parent receptive.  Breast feeding basics reviewed; hand expression taught.  Birth parent was able to express a couple of drops which I finger fed to "Samantha Walton."  Performed suck training prior to latching due to baby "biting" on my gloved finger.  He was able to begin a nice rhythmic suck with training.  Attempted to latch but he was not interested in opening his mouth to initiate a suck.  Demonstrated gentle stimulation without success. Reassurance given and suggested birth parent pump for breast stimulation; parent agreeable.  She has breast shells at bedside but has not started wearing them.  Her pump was at the bedside and she had not pumped at all today.  Discussed the advantages of wearing her breast shells for nipple eversion and beginning to pump.  Birth parent verbalized understanding.  #21 flange appropriate at this time.  Observed her pumping for 15 minutes; couple drops obtained.  Used drops for nipple care and also provided coconut oil.  Reviewed feeding plan for tonight.  Suggested she call her RN/LC for latch assistance and discussed cluster feeding.  Encouraged lots of STS, breast massage and hand expression.  Support person asleep on the couch during the consult.  RN updated.   Maternal Data Has patient been taught Hand Expression?: Yes Does the patient have breastfeeding experience prior to this delivery?: No  Feeding Mother's Current Feeding Choice:  Breast Milk  LATCH Score Latch: Too sleepy or reluctant, no latch achieved, no sucking elicited.  Audible Swallowing: None  Type of Nipple: Everted at rest and after stimulation (Short shafted)  Comfort (Breast/Nipple): Soft / non-tender  Hold (Positioning): Assistance needed to correctly position infant at breast and maintain latch.  LATCH Score: 5   Lactation Tools Discussed/Used Tools: Shells;Pump;Flanges;Coconut oil Flange Size: 21 Breast pump type: Double-Electric Breast Pump;Manual Pump Education: Setup, frequency, and cleaning;Milk Storage (Reviewed) Reason for Pumping: Breast stimulation for supplementation Pumping frequency: Every three hours Pumped volume:  (couple of drops)  Interventions Interventions: Breast feeding basics reviewed;Assisted with latch;Skin to skin;Breast massage;Hand express;Pre-pump if needed;Breast compression;Hand pump;Shells;Coconut oil;Expressed milk;Position options;Support pillows;Adjust position;DEBP;Education  Discharge Pump: Personal (Motif)  Consult Status Consult Status: Follow-up Date: 04/19/22 Follow-up type: In-patient    Little Ishikawa 04/18/2022, 6:16 PM

## 2022-04-18 NOTE — Lactation Note (Signed)
This note was copied from a baby's chart. Lactation Consultation Note  Patient Name: Boy Melanni Benway [id number]'V Date: 04/18/2022 Reason for consult: Initial assessment;Primapara;Early term 37-38.6wks Age:21 hours Mom will need assistance in latching until the baby learns how to latch. Instructed mom to pre-pump to evert nipples. Suggested mom wear shells during the day between feedings. Breast softened after pumping some. Collected a little colostrum when pumped. Gave to baby in spoon. Colostrum a little bit brown. May have rusty pipe syndrome. Explained that and OK to give to baby. Baby acted as if he wanted to feed but when got on the breast he may have suckled a couple of times that it, just mainly held in mouth. Popping off frequently. LC feels that when nipple everts more and baby can feel it in his mouth he will latch better. Hi-Nella set up DEBP. Mom shown how to use DEBP & how to disassemble, clean, & reassemble parts. Mom knows to pump q3h for 15-20 min.  Mom encouraged to feed baby 8-12 times/24 hours and with feeding cues.   Mom encouraged to waken baby for feeds if hasn't cued in 3 hrs. Mom has DEBP at home. Mom needed #21 flanges and that was big on her. Baby has receeding jaw. W/suck training baby wasn't extending his tongue, all I felt was gums biting. Mom may need a NS if baby not latching better later today. Baby gagging a few times as if going to spit up. LC clear clear fluid from mouth w/bulb syring once before feedings.  Maternal Data Has patient been taught Hand Expression?: Yes Does the patient have breastfeeding experience prior to this delivery?: No  Feeding    LATCH Score Latch: Too sleepy or reluctant, no latch achieved, no sucking elicited.  Audible Swallowing: None  Type of Nipple: Flat (semi flat/very short shaft w/stimulation)  Comfort (Breast/Nipple): Soft / non-tender  Hold (Positioning): Full assist, staff holds infant at breast  LATCH Score:  3   Lactation Tools Discussed/Used Tools: Shells;Pump;Flanges Flange Size: 21 Breast pump type: Double-Electric Breast Pump Pump Education: Setup, frequency, and cleaning;Milk Storage Reason for Pumping: semi flat/very short shaft Pumping frequency: pre-pump Pumped volume: 0.5 mL  Interventions Interventions: Breast feeding basics reviewed;Adjust position;DEBP;Assisted with latch;Support pillows;Skin to skin;Position options;Breast massage;Expressed milk;Hand express;Pre-pump if needed;Shells;LC Services brochure;Reverse pressure;Breast compression  Discharge    Consult Status Consult Status: Follow-up Date: 04/18/22 Follow-up type: In-patient    Mayana Irigoyen, Elta Guadeloupe 04/18/2022, 5:01 AM

## 2022-04-18 NOTE — Plan of Care (Signed)
Problem: Education: Goal: Knowledge of Childbirth will improve Outcome: Completed/Met Goal: Ability to make informed decisions regarding treatment and plan of care will improve Outcome: Completed/Met Goal: Ability to state and carry out methods to decrease the pain will improve Outcome: Completed/Met Goal: Individualized Educational Video(s) Outcome: Completed/Met   Problem: Coping: Goal: Ability to verbalize concerns and feelings about labor and delivery will improve Outcome: Completed/Met   Problem: Life Cycle: Goal: Ability to make normal progression through stages of labor will improve Outcome: Completed/Met Goal: Ability to effectively push during vaginal delivery will improve Outcome: Completed/Met   Problem: Role Relationship: Goal: Will demonstrate positive interactions with the child Outcome: Completed/Met   Problem: Safety: Goal: Risk of complications during labor and delivery will decrease Outcome: Completed/Met   Problem: Pain Management: Goal: Relief or control of pain from uterine contractions will improve Outcome: Completed/Met   Problem: Education: Goal: Knowledge of General Education information will improve Description: Including pain rating scale, medication(s)/side effects and non-pharmacologic comfort measures Outcome: Completed/Met   Problem: Health Behavior/Discharge Planning: Goal: Ability to manage health-related needs will improve Outcome: Completed/Met   Problem: Clinical Measurements: Goal: Ability to maintain clinical measurements within normal limits will improve Outcome: Completed/Met Goal: Will remain free from infection Outcome: Completed/Met Goal: Diagnostic test results will improve Outcome: Completed/Met Goal: Respiratory complications will improve Outcome: Completed/Met Goal: Cardiovascular complication will be avoided Outcome: Completed/Met   Problem: Activity: Goal: Risk for activity intolerance will decrease Outcome:  Completed/Met   Problem: Nutrition: Goal: Adequate nutrition will be maintained Outcome: Completed/Met   Problem: Coping: Goal: Level of anxiety will decrease Outcome: Completed/Met   Problem: Elimination: Goal: Will not experience complications related to bowel motility Outcome: Completed/Met Goal: Will not experience complications related to urinary retention Outcome: Completed/Met   Problem: Pain Managment: Goal: General experience of comfort will improve Outcome: Completed/Met   Problem: Safety: Goal: Ability to remain free from injury will improve Outcome: Completed/Met   Problem: Skin Integrity: Goal: Risk for impaired skin integrity will decrease Outcome: Completed/Met   Problem: Education: Goal: Knowledge of General Education information will improve Description: Including pain rating scale, medication(s)/side effects and non-pharmacologic comfort measures Outcome: Completed/Met   Problem: Health Behavior/Discharge Planning: Goal: Ability to manage health-related needs will improve Outcome: Completed/Met   Problem: Clinical Measurements: Goal: Ability to maintain clinical measurements within normal limits will improve Outcome: Completed/Met Goal: Will remain free from infection Outcome: Completed/Met Goal: Diagnostic test results will improve Outcome: Completed/Met Goal: Respiratory complications will improve Outcome: Completed/Met Goal: Cardiovascular complication will be avoided Outcome: Completed/Met   Problem: Activity: Goal: Risk for activity intolerance will decrease Outcome: Completed/Met   Problem: Nutrition: Goal: Adequate nutrition will be maintained Outcome: Completed/Met   Problem: Coping: Goal: Level of anxiety will decrease Outcome: Completed/Met   Problem: Elimination: Goal: Will not experience complications related to bowel motility Outcome: Completed/Met Goal: Will not experience complications related to urinary  retention Outcome: Completed/Met   Problem: Pain Managment: Goal: General experience of comfort will improve Outcome: Completed/Met   Problem: Safety: Goal: Ability to remain free from injury will improve Outcome: Completed/Met   Problem: Skin Integrity: Goal: Risk for impaired skin integrity will decrease Outcome: Completed/Met   Problem: Education: Goal: Knowledge of Childbirth will improve Outcome: Completed/Met Goal: Ability to make informed decisions regarding treatment and plan of care will improve Outcome: Completed/Met Goal: Ability to state and carry out methods to decrease the pain will improve Outcome: Completed/Met Goal: Individualized Educational Video(s) Outcome: Completed/Met   Problem: Coping: Goal: Ability to  verbalize concerns and feelings about labor and delivery will improve Outcome: Completed/Met   Problem: Life Cycle: Goal: Ability to make normal progression through stages of labor will improve Outcome: Completed/Met Goal: Ability to effectively push during vaginal delivery will improve Outcome: Completed/Met   Problem: Role Relationship: Goal: Will demonstrate positive interactions with the child Outcome: Completed/Met   Problem: Safety: Goal: Risk of complications during labor and delivery will decrease Outcome: Completed/Met   Problem: Pain Management: Goal: Relief or control of pain from uterine contractions will improve Outcome: Completed/Met

## 2022-04-18 NOTE — Lactation Note (Signed)
This note was copied from a baby's chart. Lactation Consultation Note  Patient Name: Samantha Walton MOQHU'T Date: 04/18/2022 Reason for consult: L&D Initial assessment;Primapara;Early term 37-38.6wks Age:21 hours Worked w/mom on latching. Baby unable to maintain latch. Baby wasn't opening wide enough at first then started opening wide. LC able to compress breast as sand which to get baby on when he was opening wide he would get on but maybe suckle once.  Has nasal congestion that appears to be bothersome for him.  Hand expressed a few drops of colostrum and gave to baby on spoon. Will f/u w/mom on MBU. Will use pump for pre-pumping and give shells to wear today. Left for bonding time. Maternal Data Has patient been taught Hand Expression?: Yes Does the patient have breastfeeding experience prior to this delivery?: No  Feeding    LATCH Score Latch: Repeated attempts needed to sustain latch, nipple held in mouth throughout feeding, stimulation needed to elicit sucking reflex.  Audible Swallowing: None  Type of Nipple: Flat  Comfort (Breast/Nipple): Filling, red/small blisters or bruises, mild/mod discomfort (edema)  Hold (Positioning): Full assist, staff holds infant at breast  LATCH Score: 3   Lactation Tools Discussed/Used    Interventions Interventions: Adjust position;Assisted with latch;Support pillows;Skin to skin;Position options;Breast massage;Hand express;Expressed milk;Pre-pump if needed;Reverse pressure;Breast compression  Discharge    Consult Status Consult Status: Follow-up from L&D Date: 04/18/22 Follow-up type: In-patient    Samantha Walton, Samantha Walton 04/18/2022, 1:29 AM

## 2022-04-18 NOTE — Progress Notes (Addendum)
Patient ID: Samantha Walton, female   DOB: 2000/12/16, 21 y.o.   MRN: [id number] Called by RN for clots w/ fundal rub, I&O cath 600+ml Pt w/o complaints BP 131/74   Pulse 88   Temp 98.1 F (36.7 C) (Oral)   Resp 16   Ht 5\' 2"  (1.575 m)   Wt 66.7 kg   LMP 07/22/2021 (Exact Date)   SpO2 100%   BMI 26.89 kg/m   FF u-1, LUS w/ few clots, pt uncomfortable, gave fentanyl 157mcg IV x 1, then another LUS w/ mod amt clots Cytotec 461mcg buccal and TXA given FF U-2, no further clots/heavy lochia Total EBL 373ml Roma Schanz, CNM, WHNP-BC 04/18/2022 2:10 AM

## 2022-04-18 NOTE — Discharge Summary (Addendum)
Postpartum Discharge Summary  Date of Service updated 04/19/22     Patient Name: Samantha Walton DOB: [birth year] MRN: [id number]  Date of admission: 04/17/2022 Delivery date:04/18/2022  Delivering provider: Cecilio Asper  Date of discharge: 04/19/2022  Admitting diagnosis: Spontaneous onset of labor after 59 but before 20 completed weeks gestation with delivery by planned cesarean section [O75.82] Intrauterine pregnancy: [redacted]w[redacted]d    Secondary diagnosis:  Principal Problem:   Spontaneous onset of labor after 349but before 350completed weeks gestation with delivery by planned cesarean section  Additional problems: NA    Discharge diagnosis: Term Pregnancy Delivered                                              Post partum procedures: NA Augmentation: Pitocin Complications: None  Hospital course: Onset of Labor With Vaginal Delivery      21y.o. yo G1P0 at 321w4das admitted in Latent Labor on 04/17/2022. Labor course was complicated by nothing  Membrane Rupture Time/Date: 9:30 AM ,04/17/2022   Delivery Method:Vaginal, Spontaneous  Episiotomy: None  Lacerations:  None  Patient had a postpartum course complicated by NA.  She is ambulating, tolerating a regular diet, passing flatus, and urinating well. Patient is discharged home in stable condition on 04/19/22.  Newborn Data: Birth date:04/18/2022  Birth time:12:29 AM  Gender:Female  Living status:Living  Apgars:9 ,9  Weight:2860 g   Magnesium Sulfate received: No BMZ received: No Rhophylac:N/A MMR:N/A T-DaP:Given prenatally Flu: N/A Transfusion:No  Physical exam  Vitals:   04/18/22 1148 04/18/22 1555 04/18/22 1945 04/19/22 0519  BP: 114/61 116/67 117/76 109/78  Pulse: 80 68 94 86  Resp: 17 18 18 18   Temp: 98.7 F (37.1 C) 98.2 F (36.8 C) 98.1 F (36.7 C) 98.1 F (36.7 C)  TempSrc: Oral Oral Oral Oral  SpO2: 97% 98% 97% 99%  Weight:      Height:       General: alert Lochia: appropriate Uterine  Fundus: firm Incision: N/A DVT Evaluation: No evidence of DVT seen on physical exam. Labs: Lab Results  Component Value Date   WBC 11.1 (H) 04/17/2022   HGB 12.8 04/17/2022   HCT 36.4 04/17/2022   MCV 88.8 04/17/2022   PLT 264 04/17/2022      Latest Ref Rng & Units 04/02/2022    3:00 PM  CMP  Glucose 70 - 99 mg/dL 70   BUN 6 - 20 mg/dL 14   Creatinine 0.57 - 1.00 mg/dL 0.70   Sodium 134 - 144 mmol/L 136   Potassium 3.5 - 5.2 mmol/L 4.5   Chloride 96 - 106 mmol/L 101   CO2 20 - 29 mmol/L 17   Calcium 8.7 - 10.2 mg/dL 9.4   Total Protein 6.0 - 8.5 g/dL 7.1   Total Bilirubin 0.0 - 1.2 mg/dL <0.2   Alkaline Phos 44 - 121 IU/L 261   AST 0 - 40 IU/L 40   ALT 0 - 32 IU/L 28    Edinburgh Score:    04/18/2022    3:55 PM  Edinburgh Postnatal Depression Scale Screening Tool  I have been so unhappy that I have been crying. 2     After visit meds:  Allergies as of 04/19/2022   No Known Allergies      Medication List     TAKE these medications  acetaminophen 325 MG tablet Commonly known as: Tylenol Take 2 tablets (650 mg total) by mouth every 4 (four) hours as needed (for pain scale < 4).   ibuprofen 600 MG tablet Commonly known as: ADVIL Take 1 tablet (600 mg total) by mouth every 6 (six) hours.   PRENATAL PO Take by mouth.         Discharge home in stable condition Infant Feeding: Bottle and Breast Infant Disposition:home with mother Discharge instruction: per After Visit Summary and Postpartum booklet. Activity: Advance as tolerated. Pelvic rest for 6 weeks.  Diet: routine diet Future Appointments: Future Appointments  Date Time Provider [place]  04/26/2022  8:55 AM Donnamae Jude, MD [facility]  05/11/2022 10:45 AM [facility]   Follow up Visit: Roma Schanz, CNM  P [facility] Admin Pool Please schedule this patient for PP visit in: 4-6wks  Low risk pregnancy complicated by: none  Delivery mode:   SVD  Anticipated Birth Control:  Depo  PP Procedures needed: pap  Schedule Integrated BH visit: no  Provider: Any provider   04/19/2022 Deloria Lair, DO  Attestation:  I confirm that I have verified the information documented in the resident's note and that I have also personally reperformed the physical exam and all medical decision making activities.   Patient was seen and examined by me also Agree with note Vitals stable Labs stable Fundus firm, lochia within normal limits Perineum healing Ext WNL  Ready for discharge  Seabron Spates, CNM

## 2022-04-19 ENCOUNTER — Encounter: Payer: Medicaid Other | Admitting: Family Medicine

## 2022-04-19 MED ORDER — IBUPROFEN 600 MG PO TABS: 600.0000 mg | ORAL_TABLET | Freq: Four times a day (QID) | ORAL | 0 refills | Status: AC

## 2022-04-19 MED ORDER — MEDROXYPROGESTERONE ACETATE 150 MG/ML IM SUSP
150.0000 mg | Freq: Once | INTRAMUSCULAR | Status: DC
  Filled 2022-04-19: qty 1

## 2022-04-19 MED ORDER — ACETAMINOPHEN 325 MG PO TABS: 650.0000 mg | ORAL_TABLET | ORAL | 0 refills | Status: AC | PRN

## 2022-04-19 NOTE — Progress Notes (Signed)
CSW received consult for hx of Anxiety and Depression.  CSW met with [name] to offer support and complete assessment, [name] accompanied by FOB and FOB's mother. CSW introduced self, [name] granted CSW verbal permission to speak in front of guests about anything. CSW explained reasoning for consult. [name] was welcoming, pleasant, and remained engaged during assessment. CSW and [name] discussed [name]'s mental health history. [name] reported that she was diagnosed with anxiety and depression in 2022, noting it was generalized as opposed to situational. [name] reported that she was on medication and got off of the medication. [name] denied any current need for medication. [name] reported that she is participating in therapy through her OB office, which is helpful. [name] denied needing any additional therapy resources at this time. [name] denied any current symptoms. CSW inquired about how [name] was feeling emotionally since giving birth, [name] reported that she was feeling like a wreck. CSW asked for clarity. [name] elaborated and explained that she has been emotional but is happy that infant is here. CSW acknowledged and normalized symptoms of baby blues. CSW acknowledged, normalized, and validated [name]'s feelings. CSW and [name] discussed ways to cope if PPD symptoms arise. [name] presented calm and did not demonstrate any acute mental health signs/symptoms. CSW assessed for safety, [name] denied SI and HI. CSW did not assess for domestic violence as FOB was present. CSW inquired about [name]'s support system, [name] reported that FOB, FOB's mother, and friends are supports. CSW explained to [name] that she may be more susceptible to postpartum depression due to her mental health history.   CSW provided education regarding the baby blues period vs. perinatal mood disorders, discussed treatment and gave resources for mental health follow up if concerns arise.  CSW recommends self-evaluation during the postpartum time period using the New Mom Checklist from Postpartum  Progress and encouraged [name] to contact a medical professional if symptoms are noted at any time.    CSW provided review of Sudden Infant Death Syndrome (SIDS) precautions. [name] verbalized understanding and reported having all items needed to care for infant including a car seat, basinet, and crib.   CSW identifies no further need for intervention and no barriers to discharge at this time.   CSW received and acknowledges consult for EDPS of 9.  CSW did not address EDPS score of 9 as a score of 9 on EDPS does not warrant a CSW consult.  [name] whom scores are greater than 9/yes to question 10 on Edinburgh Postpartum Depression Screen warrants a CSW consult.   Samantha Sawchuk, LCSW Clinical Social Worker [facility] Cell#: [phone] 

## 2022-04-19 NOTE — Lactation Note (Signed)
This note was copied from a baby's chart. Lactation Consultation Note  Patient Name: Boy Kelee Cunningham [id number] Date: 04/19/2022 Reason for consult: Follow-up assessment Age:21 hours  Baby out of room for circumcision.  Mother states breastfeeding has improved and baby will latch effectively on both breasts.  Discussed cluster feeding. Feed on demand with cues.  Goal 8-12+ times per day after first 24 hrs.  Place baby STS if not cueing.  Reviewed engorgement care and monitoring voids/stools. Suggest calling if latch assistance is needed.   Feeding Mother's Current Feeding Choice: Breast Milk  LATCH Score Latch: Too sleepy or reluctant, no latch achieved, no sucking elicited.  Audible Swallowing: None  Type of Nipple: Everted at rest and after stimulation  Comfort (Breast/Nipple): Soft / non-tender  Hold (Positioning): Assistance needed to correctly position infant at breast and maintain latch.  LATCH Score: 5   Interventions Interventions: Education  Discharge Discharge Education: Engorgement and breast care;Warning signs for feeding baby Pump: Personal;DEBP  Consult Status Consult Status: Complete Date: 04/19/22    Vivianne Master [facility] 04/19/2022, 10:36 AM

## 2022-04-19 NOTE — Lactation Note (Signed)
This note was copied from a baby's chart. Lactation Consultation Note  Patient Name: Boy Imagene Boss GUYQI'H Date: 04/19/2022 Reason for consult: Mother's request;Primapara;Early term 37-38.6wks Age:21 hours Woke baby up for feeding. It took a lot of stimulation but baby finally woke up looking at mom but when placed at the breast baby went to sleep. Attempted suck training, baby mainly chewed some, not very interested in sucking on finger.  Placed in bed. Dad is going to give [facility] while mom pumps. Encouraged to try again w/cues or 3 hrs from now. Reported to RN. Maternal Data    Feeding Mother's Current Feeding Choice: Breast Milk and Donor Milk Nipple Type: Slow - flow  LATCH Score Latch: Too sleepy or reluctant, no latch achieved, no sucking elicited.  Audible Swallowing: None  Type of Nipple: Flat (semi flat/very very short shaft)  Comfort (Breast/Nipple): Soft / non-tender  Hold (Positioning): Full assist, staff holds infant at breast  LATCH Score: 3   Lactation Tools Discussed/Used Tools: Pump Flange Size: 21 Breast pump type: Double-Electric Breast Pump Reason for Pumping: supplementation/stimulation Pumping frequency: q3h  Interventions    Discharge    Consult Status Consult Status: Follow-up Date: 04/19/22 Follow-up type: In-patient    Jevin Camino, [place] 04/19/2022, 1:03 AM

## 2022-04-26 ENCOUNTER — Encounter: Payer: Medicaid Other | Admitting: Family Medicine

## 2022-04-30 NOTE — BH Specialist Note (Signed)
Pt did not arrive to video visit and did not answer the phone; Left HIPPA-compliant message to call back Sherica Paternostro from [facility] at  [phone] (Edder Bellanca's office).  ?; left MyChart message for patient.  ? ?

## 2022-05-01 ENCOUNTER — Ambulatory Visit: Payer: No Typology Code available for payment source

## 2022-05-02 ENCOUNTER — Telehealth (HOSPITAL_COMMUNITY): Payer: Self-pay | Admitting: *Deleted

## 2022-05-02 NOTE — Telephone Encounter (Signed)
Left phone voicemail message.  Odis Hollingshead, RN 05-02-2022 at 9:49am
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
## 2022-05-11 ENCOUNTER — Ambulatory Visit: Payer: Medicaid Other | Admitting: Clinical

## 2022-05-11 DIAGNOSIS — Z91199 Patient's noncompliance with other medical treatment and regimen due to unspecified reason: Secondary | ICD-10-CM

## 2022-06-04 ENCOUNTER — Encounter: Payer: Self-pay | Admitting: Advanced Practice Midwife

## 2022-06-04 ENCOUNTER — Other Ambulatory Visit: Payer: Self-pay

## 2022-06-04 ENCOUNTER — Ambulatory Visit (INDEPENDENT_AMBULATORY_CARE_PROVIDER_SITE_OTHER): Payer: Medicaid Other | Admitting: Advanced Practice Midwife

## 2022-06-04 DIAGNOSIS — Z3042 Encounter for surveillance of injectable contraceptive: Secondary | ICD-10-CM | POA: Diagnosis not present

## 2022-06-04 MED ORDER — MEDROXYPROGESTERONE ACETATE 150 MG/ML IM SUSP
150.0000 mg | Freq: Once | INTRAMUSCULAR | Status: AC
  Administered 2022-06-04: 150 mg via INTRAMUSCULAR

## 2022-06-04 NOTE — Progress Notes (Signed)
Post Partum Visit Note  Samantha Walton is a 21 y.o. G26P1001 female who presents for a postpartum visit. She is 6 weeks postpartum following a normal spontaneous vaginal delivery.  I have fully reviewed the prenatal and intrapartum course. The delivery was at [redacted]w[redacted]d gestational weeks.  Anesthesia: epidural. Postpartum course has been normal. Baby is doing well. Baby is feeding by both breast and bottle - Similac Advance. Bleeding no bleeding. Bowel function is normal. Bladder function is normal. Patient is not sexually active. Contraception method is Depo-Provera injections. Postpartum depression screening: negative.   The pregnancy intention screening data noted above was reviewed. Potential methods of contraception were discussed. The patient elected to proceed with No data recorded.   Edinburgh Postnatal Depression Scale - 06/04/22 1457       Edinburgh Postnatal Depression Scale:  In the Past 7 Days   I have been able to laugh and see the funny side of things. 0    I have looked forward with enjoyment to things. 0    I have blamed myself unnecessarily when things went wrong. 0    I have been anxious or worried for no good reason. 0    I have felt scared or panicky for no good reason. 0    Things have been getting on top of me. 0    I have been so unhappy that I have had difficulty sleeping. 0    I have felt sad or miserable. 0    I have been so unhappy that I have been crying. 0    The thought of harming myself has occurred to me. 0    Edinburgh Postnatal Depression Scale Total 0             Health Maintenance Due  Topic Date Due   COVID-19 Vaccine (1) Never done   HPV VACCINES (1 - 2-dose series) Never done   PAP-Cervical Cytology Screening  Never done   PAP SMEAR-Modifier  Never done    The following portions of the patient's history were reviewed and updated as appropriate: allergies, current medications, past family history, past medical history, past social history,  past surgical history, and problem list.  Review of Systems Pertinent items noted in HPI and remainder of comprehensive ROS otherwise negative.  Objective:  BP 114/82   Wt 132 lb 3.2 oz (60 kg)   LMP 07/22/2021 (Exact Date)   Breastfeeding Yes   BMI 24.18 kg/m    VS reviewed, nursing note reviewed,  Constitutional: well developed, well nourished, no distress HEENT: normocephalic CV: normal rate Pulm/chest wall: normal effort Abdomen: soft Neuro: alert and oriented x 3 Skin: warm, dry Psych: affect normal   Assessment:   1. Postpartum care following vaginal delivery --Doing well, bonding well with baby, good support but does report less help from her boyfriend than expected. Brainstormed ways to communicate with him and ask for help without fighting.  Pt to follow up if changes in how she feels emotionally especially if conflict with her partner.    Plan:   Essential components of care per ACOG recommendations:  1.  Mood and well being: Patient with negative depression screening today. Reviewed local resources for support.  - Patient tobacco use? No.   - hx of drug use? No.    2. Infant care and feeding:  -Patient currently breastmilk feeding? Yes. Reviewed importance of draining breast regularly to support lactation.  -Social determinants of health (SDOH) reviewed in EPIC. No concerns  3. Sexuality, contraception and birth spacing - Patient does not want a pregnancy in the next year.   - Reviewed reproductive life planning. Reviewed contraceptive methods based on pt preferences and effectiveness.  Patient desired Hormonal Injection today.   - Discussed birth spacing of 18 months  4. Sleep and fatigue -Encouraged family/partner/community support of 4 hrs of uninterrupted sleep to help with mood and fatigue  5. Physical Recovery  - Discussed patients delivery and complications. She describes her labor as good. - Patient had a Vaginal, no problems at delivery.  Patient had no laceration. Perineal healing reviewed. Patient expressed understanding - Patient has urinary incontinence? No. - Patient is safe to resume physical and sexual activity  6.  Health Maintenance - HM due items addressed Yes - Last pap smear: N/A. Pt turned 21 during pregnancy. Pap smear not done at today's visit per pt preference and will schedule annual exam. -Breast Cancer screening indicated? No.   7. Chronic Disease/Pregnancy Condition follow up: None  - PCP follow up  Fatima Blank, [place] [facility] [place]

## 2022-06-04 NOTE — Addendum Note (Signed)
Addended by: Faythe Casa on: 06/04/2022 04:01 PM   Modules accepted: Orders

## 2022-06-05 LAB — POCT PREGNANCY, URINE: Preg Test, Ur: NEGATIVE

## 2022-08-20 ENCOUNTER — Ambulatory Visit: Payer: Medicaid Other

## 2024-07-06 ENCOUNTER — Emergency Department (HOSPITAL_COMMUNITY)

## 2024-07-06 ENCOUNTER — Emergency Department (HOSPITAL_COMMUNITY)
Admission: EM | Admit: 2024-07-06 | Discharge: 2024-07-06 | Disposition: A | Discharge status: Home / Self Care | Attending: Emergency Medicine | Admitting: Emergency Medicine

## 2024-07-06 ENCOUNTER — Encounter (HOSPITAL_COMMUNITY): Payer: Self-pay

## 2024-07-06 DIAGNOSIS — J45909 Unspecified asthma, uncomplicated: Secondary | ICD-10-CM | POA: Diagnosis not present

## 2024-07-06 DIAGNOSIS — J029 Acute pharyngitis, unspecified: Secondary | ICD-10-CM | POA: Insufficient documentation

## 2024-07-06 LAB — GROUP A STREP BY PCR: Group A Strep by PCR: NOT DETECTED

## 2024-07-06 MED ORDER — LIDOCAINE VISCOUS HCL 2 % MT SOLN
15.0000 mL | Freq: Once | OROMUCOSAL | Status: AC
  Administered 2024-07-06: 15 mL via OROMUCOSAL
  Filled 2024-07-06: qty 15

## 2024-07-06 MED ORDER — DEXAMETHASONE 4 MG PO TABS
10.0000 mg | ORAL_TABLET | Freq: Once | ORAL | Status: AC
  Administered 2024-07-06: 10 mg via ORAL
  Filled 2024-07-06: qty 3

## 2024-07-06 NOTE — ED Notes (Signed)
 Pt offered medication for fever and pt declined saying she will take some when she gets home.   Pt provided discharge instructions and prescription information. Pt was given the opportunity to ask questions and questions were answered.

## 2024-07-06 NOTE — ED Triage Notes (Addendum)
 Pt c/o cough, sore throat, fever, body aches, and headache x1 day.  Pain score 5/10.  Pt was seen at [facility] yesterday and URI.  Pt reports the has been taking prescriptions w/o relief.    Pt reports she recently had bronchitis.

## 2024-07-06 NOTE — ED Provider Notes (Signed)
 " [place] EMERGENCY DEPARTMENT AT [facility] Provider Note   CSN: [id number] Arrival date & time: 07/06/24  1330     Patient presents with: Headache, Sore Throat, Generalized Body Aches, and Fever   Samantha Walton is a 24 y.o. female.  Patient with past history significant for asthma, depression, anxiety presents to the emergency department with concerns of headache, sore throat, fever.  Reports that is ongoing for the last day and states that her sore throat is the most distressing symptoms stating she has a difficult time eating or drinking due to the discomfort.  States that she was diagnosed and treated for bronchitis about 3 weeks ago.  No reported chest pain or shortness of breath.  Denies any recent sick contacts.    Headache Associated symptoms: fever   Sore Throat Associated symptoms include headaches.  Fever Associated symptoms: headaches        Prior to Admission medications  Medication Sig Start Date End Date Taking? Authorizing Provider  acetaminophen  (TYLENOL ) 325 MG tablet Take 2 tablets (650 mg total) by mouth every 4 (four) hours as needed (for pain scale < 4). Patient not taking: Reported on 06/04/2022 04/19/22   Haig Stagger, DO  ibuprofen  (ADVIL ) 600 MG tablet Take 1 tablet (600 mg total) by mouth every 6 (six) hours. Patient not taking: Reported on 06/04/2022 04/19/22   Haig Stagger, DO  Prenatal Vit-Fe Fumarate-FA (PRENATAL PO) Take by mouth.    [provider]    Allergies: Patient has no known allergies.    Review of Systems  Constitutional:  Positive for fever.  Neurological:  Positive for headaches.  All other systems reviewed and are negative.   Updated Vital Signs BP 134/86   Pulse (!) 108   Temp 100.3 F (37.9 C) (Oral)   Resp 20   Ht 5' 2 (1.575 m)   Wt 59.9 kg   LMP 07/04/2024   SpO2 99%   BMI 24.14 kg/m   Physical Exam Vitals and nursing note reviewed.  Constitutional:      General: She is not in acute  distress.    Appearance: She is well-developed. She is not ill-appearing.  HENT:     Head: Normocephalic and atraumatic.     Mouth/Throat:     Mouth: Mucous membranes are moist.     Pharynx: Oropharynx is clear.     Comments: Oropharyngeal erythema but no tonsillar exudate.  Uvula is midline. Eyes:     Conjunctiva/sclera: Conjunctivae normal.  Cardiovascular:     Rate and Rhythm: Normal rate and regular rhythm.     Heart sounds: No murmur heard. Pulmonary:     Effort: Pulmonary effort is normal. No respiratory distress.     Breath sounds: Normal breath sounds.  Abdominal:     General: There is no distension.     Palpations: Abdomen is soft.     Tenderness: There is no abdominal tenderness.  Musculoskeletal:        General: No swelling.     Cervical back: Normal range of motion and neck supple. No rigidity.  Lymphadenopathy:     Cervical: No cervical adenopathy.  Skin:    General: Skin is warm and dry.     Capillary Refill: Capillary refill takes less than 2 seconds.  Neurological:     Mental Status: She is alert.  Psychiatric:        Mood and Affect: Mood normal.     (all labs ordered are listed, but only abnormal  results are displayed) Labs Reviewed  GROUP A STREP BY PCR    EKG: None  Radiology: DG Chest 2 View Result Date: 07/06/2024 CLINICAL DATA:  Cough fever EXAM: CHEST - 2 VIEW COMPARISON:  03/23/2021 FINDINGS: The heart size and mediastinal contours are within normal limits. Both lungs are clear. The visualized skeletal structures are unremarkable. IMPRESSION: No active cardiopulmonary disease. Electronically Signed   By: Luke Bun M.D.   On: 07/06/2024 15:11     Procedures   Medications Ordered in the ED  lidocaine  (XYLOCAINE ) 2 % viscous mouth solution 15 mL (15 mLs Mouth/Throat Given 07/06/24 1844)  dexamethasone  (DECADRON ) tablet 10 mg (10 mg Oral Given 07/06/24 1842)                                    Medical Decision Making Amount and/or  Complexity of Data Reviewed Radiology: ordered.  Risk Prescription drug management.   This patient presents to the ED for concern of headache, sore throat, fever.  Differential diagnosis includes strep angitis, URI, COVID-19, influenza    Additional history obtained:  Additional history obtained from chart review   Lab Tests:  I Ordered, and personally interpreted labs.  The pertinent results include: Group A strep negative   Imaging Studies ordered:  I ordered imaging studies including chest x-ray I independently visualized and interpreted imaging which showed no acute cardiopulmonary process I agree with the radiologist interpretation   Medicines ordered and prescription drug management:  I ordered medication including Decadron , viscous lidocaine  for pharyngitis Reevaluation of the patient after these medicines showed that the patient improved I have reviewed the patients home medicines and have made adjustments as needed   Problem List / ED Course:  Patient presents to the emergency department today with concerns of headache, fever, sore throat and bodyaches.  Reports symptoms ongoing for the last day.  Was seen in urgent care yesterday and diagnosed with a URI after testing for COVID and influenza was negative.  She denies any obvious or clear sick contacts.  States she was recently treated for bronchitis about 1 month ago.  Denies any difficulty swallowing but does report significant pain with swallowing. Physical exam is reassuring with no appreciable abnormal heart or lung sounds.  The oropharynx is erythematous there is no obvious tonsillar exudate and minimal swelling seen. Group A strep and chest x-ray obtained which were unremarkable.  Suspect likely viral pharyngitis.  Given a dose of Decadron  and viscous lidocaine .  Reports some improvement after medications.  Advised continued symptom management to control over the next several days.  Return precautions discussed  such as concerns for new or worsening symptoms.  She is otherwise stable for outpatient follow-up and discharged home.   Social Determinants of Health:  None  Final diagnoses:  Acute viral pharyngitis    ED Discharge Orders     None          Arhaan Chesnut A, PA-C 07/06/24 2236    Patsey Lot, MD 07/06/24 2314  "

## 2024-07-06 NOTE — Discharge Instructions (Addendum)
 You were seen in the ER today for concerns of a headache, bodyaches, and sore throat. You were negative for strep and your chest xray was clear. With your negative test for COVID and influenza earlier, I suspect you likely have viral pharyngitis causing these symptoms. You were given a one time dose of a steroid to try to help address the sore throat and there is not much else to do to help these symptoms except to focus on hydration and manage your symptoms as best as possible. For any concerns of worsening symptoms, return to the ER. Otherwise, follow up closely with your primary care provider.
